# Patient Record
Sex: Female | Born: 1990 | Race: White | Hispanic: No | Marital: Single | Smoking: Never smoker
Health system: Southern US, Community
[De-identification: ages and names within clinical notes are randomized; demographics above are authoritative.]

## PROBLEM LIST (undated history)

## (undated) DIAGNOSIS — F909 Attention-deficit hyperactivity disorder, unspecified type: Secondary | ICD-10-CM

## (undated) DIAGNOSIS — G43709 Chronic migraine without aura, not intractable, without status migrainosus: Secondary | ICD-10-CM

## (undated) DIAGNOSIS — IMO0002 Reserved for concepts with insufficient information to code with codable children: Secondary | ICD-10-CM

## (undated) DIAGNOSIS — S83282A Other tear of lateral meniscus, current injury, left knee, initial encounter: Principal | ICD-10-CM

## (undated) DIAGNOSIS — I498 Other specified cardiac arrhythmias: Secondary | ICD-10-CM

## (undated) DIAGNOSIS — G43909 Migraine, unspecified, not intractable, without status migrainosus: Secondary | ICD-10-CM

## (undated) DIAGNOSIS — Z98811 Dental restoration status: Secondary | ICD-10-CM

## (undated) DIAGNOSIS — Z87898 Personal history of other specified conditions: Secondary | ICD-10-CM

## (undated) DIAGNOSIS — R000 Tachycardia, unspecified: Secondary | ICD-10-CM

## (undated) HISTORY — DX: Attention-deficit hyperactivity disorder, unspecified type: F90.9

## (undated) HISTORY — DX: Tachycardia, unspecified: R00.0

## (undated) HISTORY — DX: Other tear of lateral meniscus, current injury, left knee, initial encounter: S83.282A

## (undated) HISTORY — PX: WISDOM TOOTH EXTRACTION: SHX21

## (undated) HISTORY — DX: Migraine, unspecified, not intractable, without status migrainosus: G43.909

---
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
## 2010-01-27 ENCOUNTER — Telehealth: Payer: Self-pay | Admitting: Internal Medicine

## 2010-04-14 ENCOUNTER — Ambulatory Visit: Payer: Self-pay | Admitting: Internal Medicine

## 2010-04-14 DIAGNOSIS — R519 Headache, unspecified: Secondary | ICD-10-CM | POA: Insufficient documentation

## 2010-04-14 DIAGNOSIS — R51 Headache: Secondary | ICD-10-CM | POA: Insufficient documentation

## 2010-05-29 ENCOUNTER — Telehealth: Payer: Self-pay | Admitting: *Deleted

## 2010-06-22 ENCOUNTER — Telehealth: Payer: Self-pay | Admitting: Internal Medicine

## 2010-08-23 ENCOUNTER — Telehealth: Payer: Self-pay | Admitting: *Deleted

## 2010-09-06 ENCOUNTER — Telehealth: Payer: Self-pay | Admitting: Internal Medicine

## 2010-10-25 ENCOUNTER — Telehealth: Payer: Self-pay | Admitting: *Deleted

## 2010-11-21 NOTE — Progress Notes (Signed)
Summary: pt needs script for 3 month supply non generic Adderall XR  Phone Note Call from Patient Call back at Home Phone [phone]   Caller: mom-Tina Summary of Call: Pt is need script for non generic Adderall XR 25mg . Brand only. Need 3 month supply. Initial call taken by: Lucy Antigua,  November 21, 2009 11:27 AM  Follow-up for Phone Call        Rx is ready to pick up. Left message on machine. Follow-up by: Romualdo Bolk, CMA (AAMA),  November 21, 2009 5:29 PM    Prescriptions: ADDERALL XR 25 MG XR24H-CAP (AMPHETAMINE-DEXTROAMPHETAMINE) 1 by mouth once daily. DAW Brand medically necessary #90 x 0   Entered by:   Romualdo Bolk, CMA (AAMA)   Authorized by:   Madelin Headings MD   Signed by:   Romualdo Bolk, CMA (AAMA) on 11/21/2009   Method used:   Reprint   RxID:   [id number] ADDERALL XR 25 MG XR24H-CAP (AMPHETAMINE-DEXTROAMPHETAMINE) 1 by mouth once daily. DAW Brand medically necessary #90 x 0   Entered by:   Romualdo Bolk, CMA (AAMA)   Authorized by:   Madelin Headings MD   Signed by:   Romualdo Bolk, CMA (AAMA) on 11/21/2009   Method used:   Print then Give to Patient   RxID:   [id number]  Reprinted due to md signing on wrong side. Romualdo Bolk, CMA (AAMA)  November 21, 2009 5:03 PM

## 2010-11-21 NOTE — Progress Notes (Signed)
Summary: Pt req scripts for Adderall XR 25mg  and Adderall 10mg   Phone Note Refill Request Message from:  mom-[name] on June 22, 2010 1:26 PM  Refills Requested: Medication #1:  ADDERALL XR 25 MG XR24H-CAP 1 by mouth once daily. DAW [BMN]   Supply Requested: 3 months  Medication #2:  ADDERALL 10 MG TABS 1 by mouth once daily as needed   Supply Requested: 3 months Pt is coming home from college this weekend.   Method Requested: Pick up at Office Initial call taken by: Lucy Antigua,  June 22, 2010 1:26 PM  Follow-up for Phone Call        Mom called saying that she wants a 90 days on the adderall 10mg  because her classes start later now. Can we do this? Follow-up by: Romualdo Bolk, CMA (AAMA),  June 23, 2010 11:39 AM  Additional Follow-up for Phone Call Additional follow up Details #1::        ok to do as long as  meds locked up and safe in such a large amount.     Additional Follow-up by: Madelin Headings MD,  June 23, 2010 1:01 PM    Additional Follow-up for Phone Call Additional follow up Details #2::    Spoke with mom and pt keeps this in a locked box in a fake book.  Shreaded the #30 on Adderall 10mg  and gave #90.  Follow-up by: Romualdo Bolk, CMA (AAMA),  June 23, 2010 1:03 PM  Prescriptions: ADDERALL 10 MG TABS (AMPHETAMINE-DEXTROAMPHETAMINE) 1 by mouth once daily as needed  #90 x 0   Entered by:   Romualdo Bolk, CMA (AAMA)   Authorized by:   Madelin Headings MD   Signed by:   Romualdo Bolk, CMA (AAMA) on 06/23/2010   Method used:   Print then Give to Patient   RxID:   [id number] ADDERALL XR 25 MG XR24H-CAP (AMPHETAMINE-DEXTROAMPHETAMINE) 1 by mouth once daily. DAW Brand medically necessary #90 x 0   Entered by:   Romualdo Bolk, CMA (AAMA)   Authorized by:   Madelin Headings MD   Signed by:   Romualdo Bolk, CMA (AAMA) on 06/23/2010   Method used:   Print then Give to Patient   RxID:    [id number] ADDERALL 10 MG TABS (AMPHETAMINE-DEXTROAMPHETAMINE) 1 by mouth once daily as needed  #30 x 0   Entered by:   Romualdo Bolk, CMA (AAMA)   Authorized by:   Madelin Headings MD   Signed by:   Romualdo Bolk, CMA (AAMA) on 06/23/2010   Method used:   Print then Give to Patient   RxID:   [id number]

## 2010-11-21 NOTE — Progress Notes (Signed)
Summary: REQ FOR REFILL (Adderall / Adderall XR)  Phone Note Refill Request Call back at [phone]   Refills Requested: Medication #1:  ADDERALL XR 25 MG XR24H-CAP 1 by mouth once daily. DAW [BMN]   Notes: x 3 mths  Medication #2:  ADDERALL 10 MG TABS 1 by mouth once daily as needed   Notes: x 3 mths Initial call taken by: Debbra Riding,  January 27, 2010 11:37 AM Caller: Patient  [phone] Reason for Call: Refill Medication Summary of Call:  Initial call taken by: Debbra Riding,  January 27, 2010 11:34 AM  Follow-up for Phone Call        Appt made for May 24,2011. Pt does 90 days at [facility]. Follow-up by: Romualdo Bolk, CMA (AAMA),  January 27, 2010 1:34 PM    Prescriptions: ADDERALL 10 MG TABS (AMPHETAMINE-DEXTROAMPHETAMINE) 1 by mouth once daily as needed  #30 x 0   Entered by:   Romualdo Bolk, CMA (AAMA)   Authorized by:   Madelin Headings MD   Signed by:   Romualdo Bolk, CMA (AAMA) on 01/27/2010   Method used:   Print then Give to Patient   RxID:   [id number] ADDERALL XR 25 MG XR24H-CAP (AMPHETAMINE-DEXTROAMPHETAMINE) 1 by mouth once daily. DAW Brand medically necessary #90 x 0   Entered by:   Romualdo Bolk, CMA (AAMA)   Authorized by:   Madelin Headings MD   Signed by:   Romualdo Bolk, CMA (AAMA) on 01/27/2010   Method used:   Print then Give to Patient   RxID:   [id number]

## 2010-11-21 NOTE — Progress Notes (Signed)
Summary: refill on loestrin fe 24  Phone Note From Pharmacy   Caller: [facility]* Reason for Call: Needs renewal Details for Reason: Loestrin fe 24 Summary of Call: last filled on 06/07/10 #84 Initial call taken by: Romualdo Bolk, CMA (AAMA),  August 23, 2010 3:38 PM  Follow-up for Phone Call        refill x1  Follow-up by: Madelin Headings MD,  August 23, 2010 5:02 PM  Additional Follow-up for Phone Call Additional follow up Details #1::        Rx sent to pharmacy. Additional Follow-up by: Romualdo Bolk, CMA (AAMA),  August 24, 2010 8:31 AM    New/Updated Medications: LOESTRIN FE 1/20 1-20 MG-MCG TABS (NORETHIN ACE-ETH ESTRAD-FE) 1 by mouth once daily Prescriptions: LOESTRIN FE 1/20 1-20 MG-MCG TABS (NORETHIN ACE-ETH ESTRAD-FE) 1 by mouth once daily  #84 x 0   Entered by:   Romualdo Bolk, CMA (AAMA)   Authorized by:   Madelin Headings MD   Signed by:   Romualdo Bolk, CMA (AAMA) on 08/24/2010   Method used:   Electronically to        [facility]* (retail)       [address]       [address] [place] [address]       [place], [place]  [address]       Ph: [phone]       Fax: [phone]   RxID:   [id number]

## 2010-11-21 NOTE — Progress Notes (Signed)
Summary: REFILL  Phone Note Refill Request Call back at [phone]   Refills Requested: Medication #1:  ADDERALL XR 25 MG XR24H-CAP 1 by mouth once daily  Medication #2:  ADDERALL 10 MG TABS 1 by mouth once daily as needed MARK AS BRAND NAME ONLY ON XR 25MG . CALL MOM WHEN READY.  Initial call taken by: Warnell Forester,  October 26, 2009 9:04 AM  Follow-up for Phone Call        Mom aware that rx is ready to pick up. Follow-up by: Romualdo Bolk, CMA (AAMA),  October 26, 2009 9:51 AM    New/Updated Medications: ADDERALL XR 25 MG XR24H-CAP (AMPHETAMINE-DEXTROAMPHETAMINE) 1 by mouth once daily. DAW [BMN] Prescriptions: ADDERALL XR 25 MG XR24H-CAP (AMPHETAMINE-DEXTROAMPHETAMINE) 1 by mouth once daily. DAW Brand medically necessary #30 x 0   Entered by:   Romualdo Bolk, CMA (AAMA)   Authorized by:   Madelin Headings MD   Signed by:   Romualdo Bolk, CMA (AAMA) on 10/26/2009   Method used:   Reprint   RxID:   [id number] ADDERALL 10 MG TABS (AMPHETAMINE-DEXTROAMPHETAMINE) 1 by mouth once daily as needed  #30 x 0   Entered by:   Romualdo Bolk, CMA (AAMA)   Authorized by:   Madelin Headings MD   Signed by:   Romualdo Bolk, CMA (AAMA) on 10/26/2009   Method used:   Print then Give to Patient   RxID:   [id number] ADDERALL XR 25 MG XR24H-CAP (AMPHETAMINE-DEXTROAMPHETAMINE) 1 by mouth once daily Brand medically necessary #30 x 0   Entered by:   Romualdo Bolk, CMA (AAMA)   Authorized by:   Madelin Headings MD   Signed by:   Romualdo Bolk, CMA (AAMA) on 10/26/2009   Method used:   Print then Give to Patient   RxID:   [id number]  Reprinted due to md signing wrong slot. Romualdo Bolk, CMA (AAMA)  October 26, 2009 9:49 AM

## 2010-11-21 NOTE — Progress Notes (Signed)
Summary: new rx  Phone Note Call from Patient Call back at Home Phone [phone]   Caller: Patient Call For: Madelin Headings MD Summary of Call: pt needs new rx  adderall 10 mg Initial call taken by: Heron Sabins,  May 29, 2010 2:35 PM  Follow-up for Phone Call        done Follow-up by: Nelwyn Salisbury MD,  May 29, 2010 4:35 PM  Additional Follow-up for Phone Call Additional follow up Details #1::        Pt's mom aware that rx is ready to pick up Additional Follow-up by: Romualdo Bolk, CMA (AAMA),  May 29, 2010 4:50 PM    Prescriptions: ADDERALL 10 MG TABS (AMPHETAMINE-DEXTROAMPHETAMINE) 1 by mouth once daily as needed  #30 x 0   Entered and Authorized by:   Nelwyn Salisbury MD   Signed by:   Nelwyn Salisbury MD on 05/29/2010   Method used:   Print then Give to Patient   RxID:   [id number]

## 2010-11-21 NOTE — Assessment & Plan Note (Signed)
Summary: well child ck/ssc   Vital Signs:  Patient profile:   20 year old female Menstrual status:  regular LMP:     03/31/2010 Height:      65.25 inches Weight:      110 pounds BMI:     18.23 Pulse rate:   80 / minute BP sitting:   100 / 60  (right arm) Cuff size:   regular  Vitals Entered By: Romualdo Bolk, CMA (AAMA) (April 14, 2010 2:17 PM) CC: Well Child Check  Vision Screening:Left eye w/o correction: 20 / 20 Right Eye w/o correction: 20 / 20 Both eyes w/o correction:  20/ 20        Vision Entered By: Romualdo Bolk, CMA Duncan Dull) (April 14, 2010 2:36 PM) LMP (date): 03/31/2010 LMP - Character: light Menarche (age onset years): 13-14   Menses interval (days): 28 Menstrual flow (days): 4 Enter LMP: 03/31/2010  Bright Futures-17-21 Years Female  Questions or Concerns: None   HEALTH   Health Status: good   ER Visits: 0   Hospitalizations: 1   Discharge Diagnosis: ACL Repair   Immunization Reaction: no reaction   Dental Visit-last 6 months yes   Brushing Teeth twice a day   Flossing once a day  HOME/FAMILY   Lives with: mother & father   Guardian: mother & father   # of Siblings: 1   Lives In: house   Shares Bedroom: no   Passive Smoke Exposure: no   Caregiver Relationships: good with mother   Father Involvement: involved   Pets in Home: yes   Type of Pets: dogs  SUBSTANCE USE   Tobacco Exposure: no tobacco use in home or friends   Tobacco Use: never   Alcohol Exposure: friends using alcohol   Alcohol Use: tried-not currently using   Marijuana Exposure: no marijuana use in home or friends   Illicit Drug Exposure: no illicit drug use in home or friends  SEXUALITY   Exposure to Sex: no friends are sexually active   Sexually Active: yes   Age of first sexual contact: 15   Lifetime Partners: 3   Condom Use: always   LMP: 03/31/2010  CURRENT HISTORY   Diet/Food: not all four food groups, frequent junk food, good appetite, and Occ. eats  from all four food groups and occ junk food.     Milk: inadequate calcium intake.     Drinks: juice 8-16 oz/day and water.     Carbonated/Caffeine Drinks: yes carbonated, yes caffeine, and <8 oz/day.     Sleep: 8hrs or more/night, has problems, frequent awakenings, no co-bedding, and in own room.     Exercise: none.     TV/Computer/Video: >2 hours total/day, has computer at home, and content not monitored.     Friends: many friends and has someone to talk to with issues.     Mental Health: high self esteem and positive body image.    SCHOOL/SCREENING   Grade Level: college and [employer].     School Performance: excellent.     Future Career Goals: college.     Behavior Concerns: no.     Vision/Hearing: no concerns with vision and no concerns with hearing.    Well Child Visit/Preventive Care  Age:  20 years old female  Home:     good family relationships, communication between adolescent/parent, and has responsibilities at home Education:     As, Bs, and good attendance Activities:     exercise, friends, and >  2 hrs TV/Computer Auto/Safety:     seatbelts, bike helmets, water safety, and sunscreen use  History of Present Illness: Cynthia Sanchez comes in today  for wellness check and med check. With mom also.  since last visit has doen well taking adderall on school days . gets HA worsening when wearing off. HA  : saw HA cloininc and giving muscl realxant  as neede mild and then strong if needed. did nt want to try a"another pill"  for suppression of HA  but trying to stop  meds such as advil  . ocass tylenol   . states  Migraines better on OCPS   otherwise daily pm HAs  continuing but not progressive. Mom want to rewrite roxinal rx   . last one was never filled and she brings it in to shred and rewrite.  So Nerida will have this  when goes  on campus.    Past History:  Past medical, surgical, family and social histories (including risk factors) reviewed, and no changes noted  (except as noted below).  Past Medical History: Reviewed history from 04/01/2009 and no changes required. ADHD Chicken pox as a child  Migraines    Nl birth hx.   Past Surgical History: Reviewed history from 04/01/2009 and no changes required. ACL Repaired 2009 left   Tonsillectomy  2008 Adenoidectomy  Past History:  Care Management: Orthopedics: Thurston Hole Gynecology: Richardson Dopp  Family History: Reviewed history from 10/03/2009 and no changes required. Father: Asthma Mother: See Mom's chart breast cancer ADHD, lipids  Siblings: Asthma Maternal Grandmother:  Maternal Grandfather:  Paternal Grandmother:  Paternal Grandfather: LIPIDs in GPS  Fa hx of HAs   Social History: Reviewed history from 08/18/2009 and no changes required. hhof 3  neg ets. Not a vegan  Exercise es    Neg tad  go to [employer] w[employer]ats to be Physc assist.  sleep interrupted.  Father Md Mom RN  light sleeper  .     Review of Systems  The patient denies anorexia, fever, weight loss, weight gain, vision loss, decreased hearing, hoarseness, chest pain, syncope, dyspnea on exertion, peripheral edema, prolonged cough, abdominal pain, melena, hematochezia, severe indigestion/heartburn, muscle weakness, transient blindness, difficulty walking, depression, abnormal bleeding, enlarged lymph nodes, and angioedema.         left knee sometimes feels off ( one hadacl repair per dr Thurston Hole) no popping of giving out  labs from october printed and reviewed  Physical Exam  General:      Well appearing adolescent,no acute distress Head:      normocephalic and atraumatic  Eyes:      PERRL, EOMs full, conjunctiva clear  Ears:      TM's pearly gray with normal light reflex and landmarks, canals clear  Nose:      Clear without Rhinorrhea Mouth:      Clear without erythema, edema or exudate, mucous membranes moist teeth in good repeair Neck:      supple without adenopathy  Chest wall:      no deformities or breast masses  noted.  Tanner IV Breast and Tanner V Breast.   Lungs:      Clear to ausc, no crackles, rhonchi or wheezing, no grunting, flaring or retractions  Heart:      RRR without murmur quiet precordium.   Abdomen:      BS+, soft, non-tender, no masses, no hepatosplenomegaly  Genitalia:      normal female  Musculoskeletal:      no scoliosis, normal gait,  normal posture ortho no acute change  left knee  no swelling or redness or tenderness ? drawer Pulses:      femoral pulses present  without delay  Extremities:      Well perfused with no cyanosis or deformity noted  Neurologic:      Neurologic exam intact  Developmental:      alert and cooperative  Skin:      intact without lesions, rashes  Cervical nodes:      no significant adenopathy.   Axillary nodes:      no significant adenopathy.   Inguinal nodes:      no significant adenopathy.   Psychiatric:      alert and cooperative   Impression & Recommendations:  Problem # 1:  Preventive Health Care (ICD-V70.0)  counseled .      Discussed nutrition,exercise,diet,healthy weight, vitamin D and calcium.   reviewed labs from october 2010  Orders: Vision Screening ([address])  Problem # 2:  ADHD (ICD-314.01) disc  use of med at school  and   seuring scheduled  meds on campus .    refill med no change for now   Problem # 3:  MIGRAINE HEADACHE (ICD-346.90) eval  by  HA clininc.   previous rx dated 12  13 2010 shredded and new one written and signed  to be used as rescue. Her updated medication list for this problem includes:    Roxicet 5-325 Mg/82ml Soln (Oxycodone-acetaminophen) .Marland Kitchen... 1 tsp   as needed headache rescue medication.    Maxalt-mlt 10 Mg Tbdp (Rizatriptan benzoate) ..... Use as needed migraine  can repeat in 4 hours  Problem # 4:  HEADACHE (ICD-784.0)  almost daily HAs    counseled about  med rebound HAs and use of meds  . if having to take med more frequently then track and follow up .  Her updated medication list for this  problem includes:    Roxicet 5-325 Mg/44ml Soln (Oxycodone-acetaminophen) .Marland Kitchen... 1 tsp   as needed headache rescue medication.    Maxalt-mlt 10 Mg Tbdp (Rizatriptan benzoate) ..... 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 1 by mouth once daily. daw 3)  Adderall 10 Mg Tabs (Amphetamine-dextroamphetamine) .Marland Kitchen.. 1 by mouth once daily as needed 4)  Roxicet 5-325 Mg/57ml Soln (Oxycodone-acetaminophen) .Marland Kitchen.. 1 tsp   as needed headache rescue medication. 5)  Maxalt-mlt 10 Mg Tbdp (Rizatriptan benzoate) .... Use as needed migraine  can repeat in 4 hours  Patient Instructions: 1)  continue same med dose   rov in 6 months  . 2)  disc caution for HA rebound.   3)  Do knee exercises  and if problem follow up with Dr Thurston Hole Prescriptions: ADDERALL 10 MG TABS (AMPHETAMINE-DEXTROAMPHETAMINE) 1 by mouth once daily as needed  #30 x 0   Entered and Authorized by:   Madelin Headings MD   Signed by:   Madelin Headings MD on 04/14/2010   Method used:   Print then Give to Patient   RxID:   [id number] ADDERALL XR 25 MG XR24H-CAP (AMPHETAMINE-DEXTROAMPHETAMINE) 1 by mouth once daily. DAW Brand medically necessary #90 x 0   Entered and Authorized by:   Madelin Headings MD   Signed by:   Madelin Headings MD  on 04/14/2010   Method used:   Print then Give to Patient   RxID:   [id number] ROXICET 5-325 MG/5ML SOLN (OXYCODONE-ACETAMINOPHEN) 1 tsp   as needed headache rescue medication.  #60cc x 0   Entered and Authorized by:   Madelin Headings MD   Signed by:   Madelin Headings MD on 04/14/2010   Method used:   Print then Give to Patient   RxID:   [id number]  ]

## 2010-11-21 NOTE — Progress Notes (Signed)
Summary: refill on adderall  Phone Note Call from Patient Call back at Home Phone [phone] Call back at [phone]   Caller: Mom Summary of Call: refill on adderall 10mg  Initial call taken by: Romualdo Bolk, CMA (AAMA),  September 06, 2010 4:56 PM    Prescriptions: ADDERALL 10 MG TABS (AMPHETAMINE-DEXTROAMPHETAMINE) 1 by mouth once daily as needed  #90 x 0   Entered by:   Romualdo Bolk, CMA (AAMA)   Authorized by:   Madelin Headings MD   Signed by:   Romualdo Bolk, CMA (AAMA) on 09/06/2010   Method used:   Print then Give to Patient   RxID:   [id number]

## 2010-11-21 NOTE — Consult Note (Signed)
[facility]   Imported By: Maryln Gottron 10/31/2009 15:15:33  _____________________________________________________________________  External Attachment:    Type:   Image     Comment:   External Document

## 2010-11-23 NOTE — Progress Notes (Signed)
Summary: Need new script for Loestrin for migraines for continous usage  Phone Note Refill Request Call back at [phone] Tinas cell Message from:  momInetta Fermo on October 25, 2010 3:42 PM  Refills Requested: Medication #1:  LOESTRIN FE 1/20 1-20 MG-MCG TABS 1 by mouth once daily   Dosage confirmed as above?Dosage Confirmed Pts mom said pharmacy will not fill, because original script was not written for continous for migraine headaches. Pls re-write script and send to [facility] asap.    Method Requested: Telephone to Pharmacy Initial call taken by: Lucy Antigua,  October 25, 2010 3:42 PM  Follow-up for Phone Call        Pt needs to schedule a follow up appt before next refill. Rx sent to pharmacy. Mom to have pt schedule a follow up appt before her next refill. Follow-up by: Romualdo Bolk, CMA (AAMA),  October 25, 2010 3:56 PM    New/Updated Medications: LOESTRIN FE 1/20 1-20 MG-MCG TABS (NORETHIN ACE-ETH ESTRAD-FE) 1 by mouth once daily continous for migraine headaches Prescriptions: LOESTRIN FE 1/20 1-20 MG-MCG TABS (NORETHIN ACE-ETH ESTRAD-FE) 1 by mouth once daily continous for migraine headaches  #84 x 0   Entered by:   Romualdo Bolk, CMA (AAMA)   Authorized by:   Madelin Headings MD   Signed by:   Romualdo Bolk, CMA (AAMA) on 10/25/2010   Method used:   Electronically to        [facility]* (retail)       [address]       [address] [place] [address]       [place], [place]  [address]       Ph: [phone]       Fax: [phone]   RxID:   [id number]

## 2010-12-19 ENCOUNTER — Telehealth: Payer: Self-pay | Admitting: *Deleted

## 2010-12-19 MED ORDER — AMPHETAMINE-DEXTROAMPHETAMINE 10 MG PO TABS: 10.0000 mg | ORAL_TABLET | Freq: Every day | ORAL | Status: DC

## 2010-12-19 MED ORDER — AMPHETAMINE-DEXTROAMPHET ER 25 MG PO CP24: 25.0000 mg | ORAL_CAPSULE | ORAL | Status: DC

## 2011-01-25 ENCOUNTER — Other Ambulatory Visit: Payer: Self-pay | Admitting: Internal Medicine

## 2011-01-30 ENCOUNTER — Telehealth: Payer: Self-pay | Admitting: *Deleted

## 2011-01-30 MED ORDER — AMPHETAMINE-DEXTROAMPHETAMINE 10 MG PO TABS: 10.0000 mg | ORAL_TABLET | Freq: Every day | ORAL | Status: DC

## 2011-01-30 NOTE — Telephone Encounter (Signed)
Mom called and states that she misplaced pt's rx because it was too early to refill. She would like to have another rx. Per Dr. Fabian Sharp- okay to do.

## 2011-01-30 NOTE — Telephone Encounter (Signed)
Pt's mom aware that rx is ready to pick up

## 2011-03-06 NOTE — Op Note (Signed)
NAMEJERRINE, URSCHEL NO.:  [id number]   MEDICAL RECORD NO.:  [id number]          PATIENT TYPE:  AMB   LOCATION:  DSC                          FACILITY:  [facility]   PHYSICIAN:  Robert A. Thurston Hole, M.D. DATE OF BIRTH:  08-Jun-1991   DATE OF PROCEDURE:  03/02/2008  DATE OF DISCHARGE:  03/03/2008                               OPERATIVE REPORT   PREOPERATIVE DIAGNOSES:  1. Left knee anterior cruciate ligament tear.  2. Left knee lateral meniscus tear.   POSTOPERATIVE DIAGNOSES:  1. Left knee anterior cruciate ligament tear.  2. Left knee lateral meniscus tear.   PROCEDURE:  1. Left knee examination under anesthesia followed by arthroscopically      assisted endoscopic hamstring autograft ACL reconstruction using 8      x 28-mm femoral interference BioScrew and 9 x 28-mm tibial      interference BioScrew with PushLock Anchor for tibial post.  2. Left knee lateral meniscus repair using Arthrex meniscal repair      suture x 1.   SURGEON:  Elana Alm. Thurston Hole, M.D.   ASSISTANT:  Julien Girt, PA   ANESTHESIA:  General.   OPERATIVE TIME:  1 hour and 45 minutes.   COMPLICATIONS:  None.   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  Examined MRI has revealed a complete ACL  tear with possible lateral meniscus tear.  She is now to undergo  arthroscopy with ACL reconstruction.   DESCRIPTION:  Tyishia was brought to the operating room on Mar 02, 2008,  after femoral nerve block was placed in holding area by anesthesia.  She  was placed on the operative table in supine position.  After being  placed under general anesthesia, she received Ancef 1 g IV  preoperatively for prophylaxis.  Her left knee was examined under  anesthesia.  She had full range of motion, 3+ Lachman, and positive  pivot shift.  Knee is stable to varus valgus and posterior stress  with  normal patellar tracking.  The knee was sterilely injected with 0.25%  Marcaine with epinephrine.  The left leg was prepped using sterile  DuraPrep and draped using sterile technique.  Originally, through an  anterolateral portal, an arthroscope with the pump attached was placed  into an anteromedial portal and arthroscopic probe was placed.  On  initial inspection of medial compartment, the articular cartilage was  normal, and medial meniscus was normal.  Intercondylar notch was  inspected and the anterior and posterior cruciate ligaments were normal.  Lateral compartment was inspected, the articular cartilage was normal.  Lateral meniscus showed a tear of the posterior horn measuring 1.5 cm.  This was amendable to repair and one Arthrex meniscal suture was placed  in a vertical mattress suture technique with a firm and tight repair of  the this posterior horn tear.  The lateral and anterior horn of the  lateral meniscus was intact.  Popliteus tendon was intact.  Patellofemoral joint articular cartilage was normal.  The patellar  tracked normally.  Medial and lateral gutters were free of pathology.  At this point, the hamstring graft was exposed using a 3-4 cm oblique  incision of the semitendinosus and gracilis tendon attachments on the  tibia.  Underlying subcutaneous tissues were incised along with skin  incision.  Careful dissection was carried out exposing both the gracilis  and semitendinosus tendons.  Each of these were separated and then they  were harvested using a tendon stripper.  At this point, Julien Girt,  whose surgical and medical assistance, was absolutely  medically necessary to expedite the procedure and prepare the graft  while I prepared the inside of the knee to accept the graft.  She  prepared the hamstring tendons on the back surgical table while I  prepared the inside of the knee to accept this graft.  Using a ACL  tibial drill guide through the  hamstring donor site, a Steinmann pin was  drilled up in the ACL insertion point to the tibial plateau and then  overdrilled with a 7-mm drill and then this tunnel was expanded with  tunnel expanders to 8.5.  Through this tibial tunnel posterior femoral  guide was placed in the posterior femoral notch.  Steinmann pin was then  drilled up in the ACL origin point and then overdrilled with an 8-mm  drill to the depth of 30 mm leaving a posterior 2-mm bone ridge.  This  tunnel was also expanded up to an 8.5 size with a tunnel expander.  At  this point, then a double pin passer was brought up through the tibial  tunnel and joined it up to the femoral tunnel through the femoral cortex  and tied to a stab wound.  This was then used to pass the ACL hamstring  graft up to the tibial tunnel and join it into the femoral tunnel.  It  was locked in position there with a 8 x 28-mm interference BioScrew.  The knee was then brought to a full range of motion.  There was found to  be no impingement of the graft.  The tibial end of the graft was then  locked in position in the tibial tunnel with a 9 x 28-mm interference  BioScrew while Kirstin Shepperson held the knee in a 30 degrees flexed  position with the tibia held reduced on the femur.  After this was done,  then this tibial end of the graft was further secured with a 4.5-mm  PushLock, predrilling in the proximal tibia below the tibial tunnel and  then deploying the PushLock thus further backing up the tibial  interference screw.  At this point, the knee was tested for stability.  The Lachman and pivot shift were found to be gently eliminated and knee  could be brought to full range of motion with no impingement of the  graft and the lateral meniscus repair was found to be stable.  At this  point, it was felt that all pathology has been satisfactorily addressed.  The instruments were removed.  The hamstring donor site incision was  closed with 2-0  Vicryl and 4-0 Monocryl.  The orthoscopic port was  closed with 3-0 nylon.  The knee and wound were injected with 0.25%  Marcaine with epinephrine.  Sterile dressing and a long-leg splint  applied and then patient awakened and taken to recovery room in stable  condition.  Needle and sponge counts were correct x 2 at the end of the  case.   FOLLOWUP CARE:  Sehar will be followed overnight [facility] for  IV pain control, neurovascular monitoring and CPM.  Discharge tomorrow  on Percocet and Robaxin with a home CPM.  She will be seen back in the  office in a week for sutures out and followup.      Robert A. Thurston Hole, M.D.  Electronically Signed     RAW/MEDQ  D:  03/02/2008  T:  03/03/2008  Job:  [id number]

## 2011-03-06 NOTE — Op Note (Signed)
NAMEREANNE, NELLUMS NO.:  [id number]   MEDICAL RECORD NO.:  [id number]          PATIENT TYPE:  AMB   LOCATION:  DSC                          FACILITY:  [facility]   PHYSICIAN:  Newman Pies, MD            DATE OF BIRTH:  12-13-1990   DATE OF PROCEDURE:  10/10/2007  DATE OF DISCHARGE:                               OPERATIVE REPORT   SURGEON:  Newman Pies, MD   PREOPERATIVE DIAGNOSES:  1. Adenotonsillar hypertrophy.  2. Chronic tonsillitis/pharyngitis.   POSTOPERATIVE DIAGNOSES:  1. Adenotonsillar hypertrophy.  2. Chronic tonsillitis/pharyngitis.   PROCEDURE PERFORMED:  Adenotonsillectomy   ANESTHESIA:  General endotracheal tube anesthesia.   COMPLICATIONS:  None.   ESTIMATED BLOOD LOSS:  Minimal.   INDICATIONS FOR PROCEDURE:  Cynthia Sanchez is a 20 year old white female  with a history of chronic tonsillitis/pharyngitis, refractory to  treatment with multiple courses of antibiotics.  On examination, she was  noted to have significant adenotonsillar hypertrophy.  Based on the  findings the decision was made for the patient to undergo  adenotonsillectomy.  The risks, benefits, alternatives and details of  the procedure were discussed with the patient and her mother.  Questions  were invited and answered.  They elected to proceed with the procedure.  Informed consent was obtained.   DESCRIPTION OF PROCEDURE:  The patient was taken to the operating room  and placed supine on the operating table.  General endotracheal tube  anesthesia was administered by the anesthesiologist.  Preop IV  antibiotic and Decadron were given.  The patient was then positioned and  prepped and draped in a standard fashion for adenotonsillectomy.  A  Crowe-Davis mouth gag was inserted into the oral cavity for exposure.  Palpation and inspection of the palate reveals no submucous cleft or  bifidity.  A red rubber catheter was inserted via the left nostril and  was used to gently retract the  soft palate.  Indirect mirror examination  of the nasopharynx reveals significant adenoid hyperplasia, obstructing  approximately 80% of the nasopharynx.  The adenoid was resected with an  electric cut adenotome.  Hemostasis was achieved with the coblator  device.  The right tonsil was grasped with a straight Allis clamp and  retracted medially.  It was resected from the underlying pharyngeal  constrictor muscles with the coblator device.  Hemostasis was also  achieved with the coblator device.  The same procedure was repeated on  the left side without exception.  The tonsils and adenoids were sent to  the pathology department.  The surgical sites were copiously irrigated.  An orogastric tube was passed to evacuate the stomach contents.  The  Crowe-Davis mouth gag was released.  Final inspection of the lips,  teeth, tongue and surrounding structures revealed no evidence of injury.  The care of the patient was turned over to the anesthesiologist.  The  patient was awakened from anesthesia without difficulty.  She was  extubated and transferred to the recovery room in good condition.   OPERATIVE FINDINGS:  Tonsils 2+ bilaterally.  Significant adenoid  hyperplasia.  SPECIMENS REMOVED:  Adenoids and tonsils.   FOLLOWUP CARE:  The patient will be observed in the postanesthetic care  unit.  She will be discharged home once she is awake, alert and  tolerating p.o.  She will follow up in my office in approximately 2  weeks.  She will be placed on Roxicet 5-10 mL p.o. q.4-6 h p.r.n. pain  and amoxicillin 500 mg p.o. t.i.d. for 5 days.      Newman Pies, MD  Electronically Signed     ST/MEDQ  D:  10/10/2007  T:  10/10/2007  Job:  [id number]

## 2011-03-27 ENCOUNTER — Ambulatory Visit (INDEPENDENT_AMBULATORY_CARE_PROVIDER_SITE_OTHER)
Admission: RE | Admit: 2011-03-27 | Discharge: 2011-03-27 | Disposition: A | Discharge status: Home / Self Care | Payer: 59 | Source: Ambulatory Visit | Attending: Internal Medicine | Admitting: Internal Medicine

## 2011-03-27 ENCOUNTER — Other Ambulatory Visit (INDEPENDENT_AMBULATORY_CARE_PROVIDER_SITE_OTHER): Payer: 59

## 2011-03-27 ENCOUNTER — Encounter: Payer: Self-pay | Admitting: Internal Medicine

## 2011-03-27 ENCOUNTER — Ambulatory Visit (INDEPENDENT_AMBULATORY_CARE_PROVIDER_SITE_OTHER): Payer: 59 | Admitting: Internal Medicine

## 2011-03-27 VITALS — BP 102/70 | HR 104 | Temp 98.6°F | Wt 108.0 lb

## 2011-03-27 DIAGNOSIS — R079 Chest pain, unspecified: Secondary | ICD-10-CM

## 2011-03-27 DIAGNOSIS — R0781 Pleurodynia: Secondary | ICD-10-CM

## 2011-03-27 DIAGNOSIS — F909 Attention-deficit hyperactivity disorder, unspecified type: Secondary | ICD-10-CM

## 2011-03-27 DIAGNOSIS — R071 Chest pain on breathing: Secondary | ICD-10-CM

## 2011-03-27 DIAGNOSIS — Z79899 Other long term (current) drug therapy: Secondary | ICD-10-CM

## 2011-03-27 DIAGNOSIS — J9383 Other pneumothorax: Secondary | ICD-10-CM

## 2011-03-27 DIAGNOSIS — Z79890 Hormone replacement therapy (postmenopausal): Secondary | ICD-10-CM | POA: Insufficient documentation

## 2011-03-27 LAB — BASIC METABOLIC PANEL
BUN: 15 mg/dL (ref 6–23)
CO2: 24 mEq/L (ref 19–32)
Calcium: 9.4 mg/dL (ref 8.4–10.5)
Chloride: 105 mEq/L (ref 96–112)
Creatinine, Ser: 0.8 mg/dL (ref 0.4–1.2)
GFR: 92.36 mL/min (ref >60.00)
Glucose, Bld: 139 mg/dL — ABNORMAL HIGH (ref 70–99)
Potassium: 3.9 mEq/L (ref 3.5–5.1)
Sodium: 138 mEq/L (ref 135–145)

## 2011-03-27 LAB — CBC WITH DIFFERENTIAL/PLATELET
Basophils Relative: 0.4 % (ref 0.0–3.0)
Eosinophils Absolute: 0.1 10*3/uL (ref 0.0–0.7)
Eosinophils Relative: 0.6 % (ref 0.0–5.0)
HCT: 39.6 % (ref 36.0–46.0)
Lymphocytes Relative: 16.8 % (ref 12.0–46.0)
Lymphs Abs: 1.4 10*3/uL (ref 0.7–4.0)
MCHC: 34.6 g/dL (ref 30.0–36.0)
MCV: 93.5 fl (ref 78.0–100.0)
Monocytes Absolute: 0.2 10*3/uL (ref 0.1–1.0)
Monocytes Relative: 2.9 % — ABNORMAL LOW (ref 3.0–12.0)
Neutrophils Relative %: 79.3 % — ABNORMAL HIGH (ref 43.0–77.0)
Platelets: 209 10*3/uL (ref 150.0–400.0)
WBC: 8.3 10*3/uL (ref 4.5–10.5)

## 2011-03-27 NOTE — Progress Notes (Signed)
Addended by: Romualdo Bolk on: 03/27/2011 05:04 PM   Modules accepted: Orders

## 2011-03-27 NOTE — Progress Notes (Signed)
  Subjective:    Patient ID: Cynthia Sanchez, female    DOB: 08/23/1991, 20 y.o.   MRN: [id number]  HPI Patient comes in today for an acute visit because of the onset last night of some left periscapular pain that radiated to the back and shoulder. And then had the onset of sharp shooting pain with deep breath not associated with cough shortness of breath or fever. Left periscapular area   And massage and then got worse and radiated to front and left shoulder . No trauma   Bending over standing make it worse and laying is most comfortable.   Pain rated 8-9/10 scale . Taking naproxyn and asa without relief.  Worse at  left parasternal and to the back.  Awoke from sleep last pm.   Denies any regular exercise trauma to the area she is left-handed. She's been home from college about a month has been hanging out mostly at the pool but no excessive exercise. She has not had this kind of pain before. Her birth control pills recently switched. Denies any leg pain trauma swelling.  Review of Systems No fever   Cough uri NVD or skin changes   Has migraines  2 in last week.  NOn smoker no asthma  Maternal uncle recently with PEs   dxed . Past history family history social history reviewed in the electronic medical record.     Objective:   Physical Exam Well-developed well-nourished in no acute distress  HEENT is grossly normal TMs clear OP clear neck supple without masses thyromegaly or bruit . Chest:  Clear to A&P without wheezes rales or rhonchi CV:  S1-S2 no gallops or murmurs peripheral perfusion is normal No clubbing cyanosis or edema Abdomen:  Sof,t normal bowel sounds without hepatosplenomegaly, no guarding rebound or masses no CVA tenderness Legs :No clubbing cyanosis or edema MS no atrophy good rom.  Chest wall no focal tenderness breast exam normal Neuro: non focal  Alert oriented.  Oriented x 3 and no noted deficits in memory, attention, and speech. Skin no acute rashes. .EKG  Sinus rate  77  Short pr no qs       Assessment & Plan:  Left chest pain pleuritic and sudden.  Prob MS CWP but atypical and on  Hormonal therapy.      R/O PE  Etc .  Disc with mom as permission per patient. Get labs including d dimer and chest x ray . If all negative  Attend to like CWP.

## 2011-03-27 NOTE — Patient Instructions (Signed)
We'll do laboratory studies and chest x-ray further evaluation depending on results. Discussed this with mom also.

## 2011-03-28 ENCOUNTER — Ambulatory Visit (INDEPENDENT_AMBULATORY_CARE_PROVIDER_SITE_OTHER)
Admission: RE | Admit: 2011-03-28 | Discharge: 2011-03-28 | Disposition: A | Discharge status: Home / Self Care | Payer: 59 | Source: Ambulatory Visit | Attending: Internal Medicine | Admitting: Internal Medicine

## 2011-03-28 ENCOUNTER — Ambulatory Visit (INDEPENDENT_AMBULATORY_CARE_PROVIDER_SITE_OTHER): Payer: 59 | Admitting: Internal Medicine

## 2011-03-28 ENCOUNTER — Encounter: Payer: Self-pay | Admitting: Internal Medicine

## 2011-03-28 VITALS — BP 100/72 | HR 80 | Temp 97.8°F | Ht 65.0 in | Wt 110.9 lb

## 2011-03-28 DIAGNOSIS — J9383 Other pneumothorax: Secondary | ICD-10-CM

## 2011-03-28 DIAGNOSIS — J9311 Primary spontaneous pneumothorax: Secondary | ICD-10-CM

## 2011-03-28 NOTE — Progress Notes (Signed)
Subjective:    Patient ID: Cynthia Sanchez, female    DOB: 05/19/1991, 20 y.o.   MRN: [id number]  HPI 20 year old female with known migraine followed by Dr Catalina Lunger at [facility] center in [place]. PMD is Dr Fabian Sharp. Referred here for new onset of left pneumothorax.   She has been on birth control pills LO-ESTRIN/MICROGESTIN for 3-4 years for menstrual cramps (takes 3 months in a row, with 1 week break for past 18 months because it turned out therapeutic for migraines). Six days ago on 5/31 changed birth control pills to [facility] (due to low libido) at the end of 3 weeks under guidance of Dr. Estanislado Pandy [facility]) in mid-point of 3 months stretch. Following this on Sunday 03/25/2011 had "bad migraine" (most recent one prior was 12/07/2010) and took tizanadine 4mg . Then on  03/26/2011 still with residual headache but also in morning had new sudden onset of left side chest pain, severe 9/10, radiating all over (coincided with change of birth control pills). Saw PMD yesterday 03/27/2011 did d-dimer was normal. CXR showed left pneumothorax (10%) and referred here. Today, pain is significantly better spontaneously wihtout treatment. Currenty pain in left sternal area with associated pain in back radiating across left shoulder. WAs constant but now intermittent. Made worse with deep inspiration. Lying down helps. Currently rates pain as very mild (2/10) and improving. Denies associated fever, wheeze, chills, nausea, vomitting, edema. No prior pneumothorax. No prior diagnosis of asthma (sister aged 1, dad, uncle, grandparents have asthma). Denies lifting heavy objects, using music equipment or menstrual cycle.  Post visit: I spoke to Dr Estanislado Pandy her gyn doc. States that no known hx of endometriosis. Recent switch was as for above. She did not patient 3-4 years ago but does not believe patient had endometriosis back then    Review of Systems  Constitutional: Negative for fever and unexpected weight change.    HENT: Negative for ear pain, nosebleeds, congestion, sore throat, rhinorrhea, sneezing, trouble swallowing, dental problem, postnasal drip and sinus pressure.   Eyes: Negative for redness and itching.  Respiratory: Positive for shortness of breath. Negative for cough, chest tightness and wheezing.   Cardiovascular: Positive for chest pain and palpitations. Negative for leg swelling.  Gastrointestinal: Negative for nausea and vomiting.  Genitourinary: Negative for dysuria.  Musculoskeletal: Negative for joint swelling.  Skin: Negative for rash.  Neurological: Positive for headaches.  Hematological: Does not bruise/bleed easily.  Psychiatric/Behavioral: Negative for dysphoric mood. The patient is not nervous/anxious.        Objective:   Physical Exam  Vitals reviewed. Constitutional: She is oriented to person, place, and time. She appears well-developed and well-nourished. No distress.       No evidence of marfan  HENT:  Head: Normocephalic and atraumatic.  Right Ear: External ear normal.  Left Ear: External ear normal.  Mouth/Throat: Oropharynx is clear and moist. No oropharyngeal exudate.  Eyes: Conjunctivae and EOM are normal. Pupils are equal, round, and reactive to light. Right eye exhibits no discharge. Left eye exhibits no discharge. No scleral icterus.  Neck: Normal range of motion. Neck supple. No JVD present. No tracheal deviation present. No thyromegaly present.  Cardiovascular: Normal rate, regular rhythm, normal heart sounds and intact distal pulses.  Exam reveals no gallop and no friction rub.   No murmur heard. Pulmonary/Chest: Effort normal and breath sounds normal. No respiratory distress. She has no wheezes. She has no rales. She exhibits no tenderness.  Abdominal: Soft. Bowel sounds are normal. She exhibits  no distension and no mass. There is no tenderness. There is no rebound and no guarding.  Musculoskeletal: Normal range of motion. She exhibits no edema and no  tenderness.  Lymphadenopathy:    She has no cervical adenopathy.  Neurological: She is alert and oriented to person, place, and time. She has normal reflexes. No cranial nerve deficit. She exhibits normal muscle tone. Coordination normal.  Skin: Skin is warm and dry. No rash noted. She is not diaphoretic. No erythema. No pallor.  Psychiatric: She has a normal mood and affect. Her behavior is normal. Judgment and thought content normal.          Assessment & Plan:

## 2011-03-28 NOTE — Patient Instructions (Addendum)
Please do cxr in 1 week We will call you with results I am going to investigate a little bit more about role of your birth control pills in this and hormonal changes Be cautious about heavy lifting, prolonged breath holds, straining, running till atleast all resolved on cxr and furhter advice during followup

## 2011-03-30 ENCOUNTER — Telehealth: Payer: Self-pay | Admitting: Internal Medicine

## 2011-03-30 NOTE — Telephone Encounter (Signed)
Cynthia Sanchez, I changed my mind. Please have her come in (not necessarily on day of cxr) but around that time for review and wrap up. You can add on

## 2011-03-30 NOTE — Telephone Encounter (Signed)
[email or web address].Yan Okray, CMA  

## 2011-04-03 NOTE — Telephone Encounter (Signed)
Pt set to see MR tomorrow at 9am. Carron Curie, CMA

## 2011-04-04 ENCOUNTER — Ambulatory Visit (INDEPENDENT_AMBULATORY_CARE_PROVIDER_SITE_OTHER): Payer: 59 | Admitting: Internal Medicine

## 2011-04-04 ENCOUNTER — Encounter: Payer: Self-pay | Admitting: Internal Medicine

## 2011-04-04 ENCOUNTER — Ambulatory Visit (INDEPENDENT_AMBULATORY_CARE_PROVIDER_SITE_OTHER)
Admission: RE | Admit: 2011-04-04 | Discharge: 2011-04-04 | Disposition: A | Discharge status: Home / Self Care | Payer: 59 | Source: Ambulatory Visit | Attending: Internal Medicine | Admitting: Internal Medicine

## 2011-04-04 VITALS — BP 102/62 | HR 73 | Temp 97.8°F | Ht 65.0 in | Wt 110.0 lb

## 2011-04-04 DIAGNOSIS — J9383 Other pneumothorax: Secondary | ICD-10-CM

## 2011-04-04 DIAGNOSIS — J9311 Primary spontaneous pneumothorax: Secondary | ICD-10-CM | POA: Insufficient documentation

## 2011-04-04 NOTE — Progress Notes (Signed)
  Subjective:    Patient ID: Cynthia Sanchez, female    DOB: Dec 25, 1990, 20 y.o.   MRN: [id number]  HPI Cynthia Sanchez returns for 1 week fu  Of Left side primary spontaneous pneumothorax. Since last week when Cynthia Sanchez cxr was improving she reports asymptomatic state. No complaints. No strenuous activities. No valsalva. No wheeze. No cough.. No dyspnea, No chest pain. Though in office she sneezed and Cynthia Sanchez left eye turned red x 1, she admitted to some spring allergy history. Otherwise well. Mom concurs.   CXR shows complete resolution of left sided pneumothorax   Review of Systems  Constitutional: Negative for fever and unexpected weight change.  HENT: Negative for ear pain, nosebleeds, congestion, sore throat, rhinorrhea, sneezing, trouble swallowing, dental problem, postnasal drip and sinus pressure.   Eyes: Negative for redness and itching.  Respiratory: Negative for cough, chest tightness, shortness of breath and wheezing.   Cardiovascular: Negative for palpitations and leg swelling.  Gastrointestinal: Negative for nausea and vomiting.  Genitourinary: Negative for dysuria.  Musculoskeletal: Negative for joint swelling.  Skin: Negative for rash.  Neurological: Negative for headaches.  Hematological: Does not bruise/bleed easily.  Psychiatric/Behavioral: Negative for dysphoric mood. The patient is not nervous/anxious.        Objective:   Physical Exam  Constitutional: She is oriented to person, place, and time. She appears well-developed and well-nourished. No distress.  HENT:  Head: Normocephalic and atraumatic.  Eyes: Pupils are equal, round, and reactive to light.  Neck: Normal range of motion.  Pulmonary/Chest: Effort normal.  Musculoskeletal: Normal range of motion. She exhibits no edema.  Neurological: She is alert and oriented to person, place, and time. No cranial nerve deficit.  Skin: Skin is warm. She is not diaphoretic.  Psychiatric: She has a normal mood and affect. Cynthia Sanchez behavior  is normal. Judgment and thought content normal.          Assessment & Plan:

## 2011-04-04 NOTE — Patient Instructions (Signed)
Your pneumothorax has resolved There is some low  risk for recurrence, IF this happens call us 24/7 for diretctions Monitor your health Avoid strenuous activities with common sense

## 2011-04-04 NOTE — Assessment & Plan Note (Signed)
REsolved. Etiology unclear. There  Is no history of renal tumors or endometriosis to invoke rare etiologies of LAM or catamenial pneumothorax. Moreover, OCPs form the Rx for endometriosis. IN discussion with Dr. Estanislado Pandy her OB/GYN last week, there is no hx of endometriosis diagnosed in Cynthia Sanchez. She denies any valsalva kind of activities. Shared some new literature linking weather changes to pneumothorax. Advised overall benign prognosis with small rate of recurrence. Advised to go to [facility] or call us 24/7 if symptoms recur. If recurs, will need CT chest and ? Abdomen and consideration for pleurodesis. Advised common sense approach to avoid strenuous activities.Return as needed.

## 2011-04-08 NOTE — Assessment & Plan Note (Signed)
Unclear why she suffered pneumothorax. No evidence of heavy straining or valsalva. No injury hx. No prior hx of endometriosis. No hx to suggest LAM (rare condition). Odd that was temporally related to OCP change and migraine. In any event it is small and spontaneously resolving. I will get repeat cxr in 1 week to reassess. Meanwhile, advised precautions. Will send copy of note to dad. For now hold off on CT chest

## 2011-04-11 ENCOUNTER — Other Ambulatory Visit: Payer: Self-pay | Admitting: Internal Medicine

## 2011-04-11 MED ORDER — AMPHETAMINE-DEXTROAMPHET ER 25 MG PO CP24: 25.0000 mg | ORAL_CAPSULE | ORAL | Status: DC

## 2011-04-11 NOTE — Telephone Encounter (Signed)
Pt's mom aware of this. 

## 2011-04-11 NOTE — Telephone Encounter (Signed)
Pt is needing script for Adderall XR 25 mg

## 2011-05-10 ENCOUNTER — Telehealth: Payer: Self-pay | Admitting: Internal Medicine

## 2011-05-10 MED ORDER — AMPHETAMINE-DEXTROAMPHETAMINE 10 MG PO TABS: 10.0000 mg | ORAL_TABLET | Freq: Every day | ORAL | Status: DC

## 2011-05-10 MED ORDER — AMPHETAMINE-DEXTROAMPHET ER 25 MG PO CP24: 25.0000 mg | ORAL_CAPSULE | ORAL | Status: DC

## 2011-05-10 NOTE — Telephone Encounter (Signed)
Refill Adderall 10mg  and Adderall xr 25mg .

## 2011-05-10 NOTE — Telephone Encounter (Signed)
Pt needs to schedule a follow up appt before next refill.  Mom aware that pt needs a follow up appt and that rx will be ready this afternoon.

## 2011-05-10 NOTE — Telephone Encounter (Signed)
rx picked up

## 2011-05-25 ENCOUNTER — Encounter: Payer: Self-pay | Admitting: Internal Medicine

## 2011-05-25 ENCOUNTER — Ambulatory Visit (INDEPENDENT_AMBULATORY_CARE_PROVIDER_SITE_OTHER): Payer: 59 | Admitting: Internal Medicine

## 2011-05-25 VITALS — BP 110/70 | HR 78 | Temp 98.8°F | Wt 107.0 lb

## 2011-05-25 DIAGNOSIS — J9311 Primary spontaneous pneumothorax: Secondary | ICD-10-CM

## 2011-05-25 DIAGNOSIS — Z79890 Hormone replacement therapy (postmenopausal): Secondary | ICD-10-CM

## 2011-05-25 DIAGNOSIS — R51 Headache: Secondary | ICD-10-CM

## 2011-05-25 DIAGNOSIS — F909 Attention-deficit hyperactivity disorder, unspecified type: Secondary | ICD-10-CM

## 2011-05-25 DIAGNOSIS — Z79899 Other long term (current) drug therapy: Secondary | ICD-10-CM

## 2011-05-25 NOTE — Progress Notes (Signed)
  Subjective:    Patient ID: Cynthia Sanchez, female    DOB: 1991-03-28, 20 y.o.   MRN: [id number]  HPI Patient comes in today for a same-day visit for followup of a number of problems. She is to go back to school soon. She is concerned because of a recent increase in her migraines. OCP were changed because of decreased libido by her GYN  Has had 3 migraines    Since then and usually doesn't get them Saw Dr Sharene Skeans   Earlier in summer before this began Change to orthocyclin from loestrin 1/20  beginning   June .   Per Dr Estanislado Pandy.   Migraines with aura hx   Takes zanaflex for her migraines NO cp sob  Collapsed lung    Or recurrance .   Review of Systems No cp sob chage in habits   Neck pain tauma or vision change to go back to school soon not taking adderall in the summer takes long and short day meds.   Past history family history social history reviewed in the electronic medical record.     Objective:   Physical Exam WDWN in nad   HEENT:  grossly nl Neck supple  No masses Chest:  Clear to A&P without wheezes rales or rhonchi CV:  S1-S2 no gallops or murmurs peripheral perfusion is normal  Abdomen:  Sof,t normal bowel sounds without hepatosplenomegaly, no guarding rebound or masses no CVA tenderness Neuro non focal.    Assessment & Plan:  Headaches  Increase  Frequency   Hx of  Aura  ? If related to ocp change done by gyne for dec libido.   Possible but unsure next step. No other   Triggers obvious.   Rec  Record HAs  And call Dr Lennie Muckle office and  See if change again is in order.    Can go back to loestrin  in the meantime if  persistent or progressive . Tried to obtain her phone number bu NA to our practice and prev practice closed this Friday afternoon.   SP pneumothorax mild and self resolved .   Close observation for recurrence as per pulm recs  ADD disc   Use of meds and consider taking more long acting during school years.  Total visit > 50% spentcounseling  and  coordinating care

## 2011-05-25 NOTE — Patient Instructions (Addendum)
Suggest contact Dr Sharene Skeans and or Dr Estanislado Pandy about the next step with the HAs  If  persistent or progressive can go back to the loestrin in the meantime.  We do not have her phone number   available to [place] today . Consider taking the xr adderall  On most school days as we discussed .

## 2011-05-29 ENCOUNTER — Telehealth: Payer: Self-pay | Admitting: Internal Medicine

## 2011-05-29 NOTE — Telephone Encounter (Signed)
Spoke with pt. She is concerned about her mother. Staff message sent to Dr. Fabian Sharp about daughters concerns. Daughter advised that nothing about her mother's health can be discussed with her but that I would send Dr. Fabian Sharp an Lorain Childes about daughter's concerns

## 2011-05-29 NOTE — Telephone Encounter (Signed)
Pt called and is req that Dr Fabian Sharp call her re: pts mother. Pls call. Pt aware that Dr Fabian Sharp is out of office until next week.

## 2011-06-11 ENCOUNTER — Telehealth: Payer: Self-pay | Admitting: Internal Medicine

## 2011-06-11 NOTE — Telephone Encounter (Signed)
Message copied by Madelin Headings on Mon Jun 11, 2011  7:29 PM ------      Message from: Beverely Low      Created: Tue May 29, 2011  4:34 PM       This young pt is concerned that her mother isn't being truthful with you about mother's drinking and smoking and wants to ensure that the proper tests are done on the mother to check for liver damage and such. Daughter was advised that I am unable to discuss her mother's health with her but that I would send you an FYI about her concerns

## 2011-07-27 LAB — POCT HEMOGLOBIN-HEMACUE
Hemoglobin: 14
Operator id: 15436

## 2011-09-06 ENCOUNTER — Telehealth: Payer: Self-pay | Admitting: Internal Medicine

## 2011-09-06 NOTE — Telephone Encounter (Signed)
Refill Aderall xr  90 days supplys and Adderal 10mg  60 day.

## 2011-09-07 MED ORDER — AMPHETAMINE-DEXTROAMPHET ER 25 MG PO CP24: 25.0000 mg | ORAL_CAPSULE | ORAL | Status: DC

## 2011-09-07 MED ORDER — AMPHETAMINE-DEXTROAMPHETAMINE 10 MG PO TABS: 10.0000 mg | ORAL_TABLET | Freq: Every day | ORAL | Status: DC

## 2011-09-07 NOTE — Telephone Encounter (Signed)
Rx ready to pick up

## 2012-01-02 ENCOUNTER — Telehealth: Payer: Self-pay | Admitting: Internal Medicine

## 2012-01-02 MED ORDER — AMPHETAMINE-DEXTROAMPHET ER 25 MG PO CP24: 25.0000 mg | ORAL_CAPSULE | ORAL | Status: DC

## 2012-01-02 MED ORDER — AMPHETAMINE-DEXTROAMPHETAMINE 10 MG PO TABS: 10.0000 mg | ORAL_TABLET | Freq: Every day | ORAL | Status: DC

## 2012-01-02 NOTE — Telephone Encounter (Signed)
Patient's mom called stating the patient need a refill on her adderal 10mg  and adderal extended release. Please assist.

## 2012-01-02 NOTE — Telephone Encounter (Signed)
Mom states that pt will be home at the end of May and will schedule an appt then.

## 2012-06-04 ENCOUNTER — Other Ambulatory Visit: Payer: Self-pay | Admitting: Internal Medicine

## 2012-06-04 NOTE — Telephone Encounter (Signed)
Pt is requesting name brand adderall xr 25 mg and adderall 10 mg

## 2012-06-05 ENCOUNTER — Telehealth: Payer: Self-pay | Admitting: Internal Medicine

## 2012-06-05 NOTE — Telephone Encounter (Signed)
See first task.

## 2012-06-05 NOTE — Telephone Encounter (Signed)
Pt called back to check the status of refill pt states that she is going to leave to go back to school Saturday and is hoping that she can pick it up before then

## 2012-06-05 NOTE — Telephone Encounter (Signed)
WP is out of the office and will not be back until Monday.  The pt has not been seen in over 1 year.  Pt must have OV before any refills can be given.  This is a controlled substance and is the law.  Pt notified by phone.  She will speak to her Mom and will possibly call back.

## 2012-06-05 NOTE — Telephone Encounter (Signed)
Pt has sch appt on 07-29-2012.

## 2012-06-06 ENCOUNTER — Telehealth: Payer: Self-pay | Admitting: Internal Medicine

## 2012-06-06 NOTE — Telephone Encounter (Signed)
Spoke to the pt.  Again, informed her, WP is on vacation.  Medication is a controlled substance and covering doctor will not fill because she has not been seen in over 1 year.

## 2012-06-06 NOTE — Telephone Encounter (Signed)
Pt called back to check the status of refill pt states that she is going to leave to go back to school Saturday and would like to hear from you today if she will be able to receive a refill

## 2012-06-09 NOTE — Telephone Encounter (Signed)
Informed [name] (mother) that rx is a controlled substance.  Will have to wait on WP to be back in the office.

## 2012-06-09 NOTE — Telephone Encounter (Signed)
Patient's mom called stating that she would like a call back concerning her daughter's adderall. Please assist.

## 2012-06-10 ENCOUNTER — Telehealth: Payer: Self-pay | Admitting: Internal Medicine

## 2012-06-10 MED ORDER — AMPHETAMINE-DEXTROAMPHETAMINE 10 MG PO TABS: 10.0000 mg | ORAL_TABLET | Freq: Every day | ORAL | Status: DC

## 2012-06-10 MED ORDER — AMPHETAMINE-DEXTROAMPHET ER 25 MG PO CP24: 25.0000 mg | ORAL_CAPSULE | ORAL | Status: DC

## 2012-06-10 NOTE — Telephone Encounter (Signed)
Picked up in the office by her sister.

## 2012-06-10 NOTE — Telephone Encounter (Signed)
Calling to check status on adderall. Please contact

## 2012-06-10 NOTE — Telephone Encounter (Signed)
Patient's mom called stating that she need the adderall today as she has to ship it overnight to her daughter. Please assist.

## 2012-06-26 ENCOUNTER — Other Ambulatory Visit: Payer: Self-pay | Admitting: Obstetrics and Gynecology

## 2012-07-01 ENCOUNTER — Telehealth: Payer: Self-pay | Admitting: Obstetrics and Gynecology

## 2012-07-01 NOTE — Telephone Encounter (Signed)
Spoke with pt rgd rx refill request from [facility]. Approved rx for microgesterin Fe 1/20 tab sig 1 po qd with 1 refill. Pt made a aex with AR on 07/29/12 @ 2:30. Pt's voice understanding . bt cma

## 2012-07-25 ENCOUNTER — Other Ambulatory Visit: Payer: Self-pay | Admitting: Internal Medicine

## 2012-07-25 ENCOUNTER — Telehealth: Payer: Self-pay | Admitting: Internal Medicine

## 2012-07-25 NOTE — Telephone Encounter (Signed)
Caller: [name]/Mother; Patient Name: Cynthia Sanchez; PCP: Berniece Andreas [facility]); Best Callback Phone Number: [phone]; Reason for call: Mother is caller.  She states [name] felt a lump in her breast.  [name] is very upset because her mother has had a history of breast cancer.  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  Mother is requesting that [name] have a mammogram before going back to school on Tuesday 07/29/12.  .  Mother states she was told "grape size" lump but has no other information.  Called Office to speak with staff at [facility]. Information provided to mother for [facility] of [place] [phone] and [place] [phone].  Mother expressed understanding. She will call back for questions, changes or concerns.

## 2012-07-27 NOTE — Telephone Encounter (Signed)
We should plan on getting an ultrasound asap but i  Will need to examine her at some point .  Thanks

## 2012-07-28 ENCOUNTER — Other Ambulatory Visit: Payer: Self-pay | Admitting: Family Medicine

## 2012-07-28 ENCOUNTER — Telehealth: Payer: Self-pay | Admitting: Internal Medicine

## 2012-07-28 DIAGNOSIS — N63 Unspecified lump in unspecified breast: Secondary | ICD-10-CM

## 2012-07-28 NOTE — Telephone Encounter (Signed)
Spoke to the pt to inform her that ultrasound has been ordered.  Will see in in the office on 07/29/12.

## 2012-07-28 NOTE — Telephone Encounter (Signed)
LM for the pt to return my call. 

## 2012-07-28 NOTE — Telephone Encounter (Signed)
Mom is calling back. She had called on Friday 07/25/12. See notes. She had requested her daughter get a mammogram today. She was advised to call the breast center and [facility]. Mom states she did and they advised her to call back to her PCP and get a referral. Office note sent with request. OFFICE PLEASE CALL MOM BACK.

## 2012-07-28 NOTE — Telephone Encounter (Signed)
Spoke to Dr. Fabian Sharp who stated an ultrasound would be better for age group.  Order placed in the computer and pt notified by telephone.

## 2012-07-29 ENCOUNTER — Ambulatory Visit (INDEPENDENT_AMBULATORY_CARE_PROVIDER_SITE_OTHER): Payer: 59 | Admitting: Obstetrics and Gynecology

## 2012-07-29 ENCOUNTER — Encounter: Payer: Self-pay | Admitting: Obstetrics and Gynecology

## 2012-07-29 ENCOUNTER — Ambulatory Visit
Admission: RE | Admit: 2012-07-29 | Discharge: 2012-07-29 | Disposition: A | Discharge status: Home / Self Care | Payer: 59 | Source: Ambulatory Visit | Attending: Internal Medicine | Admitting: Internal Medicine

## 2012-07-29 ENCOUNTER — Ambulatory Visit (INDEPENDENT_AMBULATORY_CARE_PROVIDER_SITE_OTHER): Payer: 59 | Admitting: Internal Medicine

## 2012-07-29 ENCOUNTER — Encounter: Payer: Self-pay | Admitting: Internal Medicine

## 2012-07-29 VITALS — BP 100/74 | HR 120 | Temp 98.9°F | Wt 101.0 lb

## 2012-07-29 VITALS — BP 98/62 | Resp 16 | Ht 65.0 in | Wt 101.0 lb

## 2012-07-29 DIAGNOSIS — N6325 Unspecified lump in the left breast, overlapping quadrants: Secondary | ICD-10-CM | POA: Insufficient documentation

## 2012-07-29 DIAGNOSIS — R51 Headache: Secondary | ICD-10-CM

## 2012-07-29 DIAGNOSIS — R634 Abnormal weight loss: Secondary | ICD-10-CM

## 2012-07-29 DIAGNOSIS — Z8659 Personal history of other mental and behavioral disorders: Secondary | ICD-10-CM

## 2012-07-29 DIAGNOSIS — N63 Unspecified lump in unspecified breast: Secondary | ICD-10-CM

## 2012-07-29 DIAGNOSIS — Z01419 Encounter for gynecological examination (general) (routine) without abnormal findings: Secondary | ICD-10-CM

## 2012-07-29 DIAGNOSIS — F909 Attention-deficit hyperactivity disorder, unspecified type: Secondary | ICD-10-CM

## 2012-07-29 DIAGNOSIS — Z124 Encounter for screening for malignant neoplasm of cervix: Secondary | ICD-10-CM

## 2012-07-29 DIAGNOSIS — R636 Underweight: Secondary | ICD-10-CM

## 2012-07-29 LAB — HEPATIC FUNCTION PANEL
ALT: 19 U/L (ref 0–35)
AST: 18 U/L (ref 0–37)
Alkaline Phosphatase: 35 U/L — ABNORMAL LOW (ref 39–117)
Bilirubin, Direct: 0.1 mg/dL (ref 0.0–0.3)
Total Bilirubin: 0.9 mg/dL (ref 0.3–1.2)

## 2012-07-29 LAB — POCT URINALYSIS DIPSTICK
Blood, UA: NEGATIVE
Leukocytes, UA: NEGATIVE
Nitrite, UA: NEGATIVE
pH, UA: 6

## 2012-07-29 LAB — BASIC METABOLIC PANEL
BUN: 19 mg/dL (ref 6–23)
Calcium: 9.4 mg/dL (ref 8.4–10.5)
GFR: 86.36 mL/min (ref >60.00)
Potassium: 3.3 mEq/L — ABNORMAL LOW (ref 3.5–5.1)

## 2012-07-29 LAB — CBC WITH DIFFERENTIAL/PLATELET
Eosinophils Relative: 1.1 % (ref 0.0–5.0)
HCT: 42.2 % (ref 36.0–46.0)
Lymphocytes Relative: 33.4 % (ref 12.0–46.0)
Lymphs Abs: 1.7 10*3/uL (ref 0.7–4.0)
Monocytes Relative: 4.2 % (ref 3.0–12.0)
Neutrophils Relative %: 60.7 % (ref 43.0–77.0)
Platelets: 182 10*3/uL (ref 150.0–400.0)
WBC: 5 10*3/uL (ref 4.5–10.5)

## 2012-07-29 LAB — LIPID PANEL
Cholesterol: 207 mg/dL — ABNORMAL HIGH (ref 0–200)
HDL: 45.3 mg/dL (ref >39.00)
Total CHOL/HDL Ratio: 5
Triglycerides: 125 mg/dL (ref 0.0–149.0)

## 2012-07-29 MED ORDER — NORETHIN ACE-ETH ESTRAD-FE 1-20 MG-MCG PO TABS: 1.0000 | ORAL_TABLET | Freq: Every day | ORAL | Status: DC

## 2012-07-29 MED ORDER — AMPHETAMINE-DEXTROAMPHETAMINE 10 MG PO TABS: 10.0000 mg | ORAL_TABLET | Freq: Every day | ORAL | Status: DC

## 2012-07-29 NOTE — Patient Instructions (Addendum)
Will notify you  of labs when available. I'm concerned about your weight loss. This could be from a combination of stress, busy schedule  side effects of the Adderall. At this time would not increase the Adderall until further assessment.  I believe that  Poor nutritional intake and suboptimal fluid intake is   contributing to some of your symptoms.  Please have breakfast or some meal before you take your Adderall first dose.  Would spread out the immediate release Adderall and take 10-15 mg before your class and then using add on of 5-10 mg to get a better persistence of medication and avoid peaking.  Monitor your oral intake fluids and meals.  If needed as an issue we can consider an SSRI in the future.  We are trying to arrange an immediate breast ultrasound in regard to the left breast lump.  Plan followup visit at her Christmas break or earlier. Contact for refills

## 2012-07-29 NOTE — Progress Notes (Signed)
Contraception Lo Estrin FE Last pap never, but pt wants today, as she will be 21 in 2 mos. Last Mammo Had U/S L breast today, due to lump L breast/ Probable fibroadenoma. Pt to see surgeon for recs.  Last Colonoscopy never Last Dexa Scan never Primary MD Dr. Fabian Sharp Abuse at Home none  Her father is cardiologist Dr. Dietrich Pates here in town.  Pt seen recently for possible breast lump - fibroadenoma vs dense fibrous tissue.  Filed Vitals:   07/29/12 1506  BP: 98/62  Resp: 16   ROS: noncontributory  Physical Examination: General appearance - alert, well appearing, and in no distress Neck - supple, no significant adenopathy Chest - clear to auscultation, no wheezes, rales or rhonchi, symmetric air entry Heart - normal rate and regular rhythm Abdomen - soft, nontender, nondistended, no masses or organomegaly Breasts - breasts appear normal, no suspicious masses, no skin or nipple changes or axillary nodes, area palpated in left breast but feels more like density of nl breast tissue Pelvic - normal external genitalia, vulva, vagina, cervix, uterus and adnexa Back exam - no CVAT Extremities - no edema, redness or tenderness in the calves or thighs  A/P Pt takes loestrin continually for  Pap today Her father plans to discuss u/s findings with a surgeon re recs for bx 24yr for AEX

## 2012-07-29 NOTE — Progress Notes (Signed)
Subjective:    Patient ID: Cynthia Sanchez, female    DOB: 17-Oct-1991, 20 y.o.   MRN: [id number]  HPI Patient comes in today for follow up of  multiple medical problems.  Last ov was over a year ago and  asked to come in for med evaluation  But has a new problem . inintially for adhd med check but over the last 3.5 to 4  weeks noted left breast lump non tender and is home from school for a few days.  Mom with hx breast cancer so concerned. No discharge or hx of same. Doesn't do reg checks.  ADHD: medication taking the IR on school days to be able to control the timing and taking 20 mg at a time qd so running out of this med  Taking xr  Prep on otherday when gets up later 830 or so. Asks about going up on meds .  Many times doesn eat before am dose of med for whatever reason sleep or planning and doesn't eat much during ay. Has never drank a lot of fluids but now less.  Very busy with classes. Mom concerned about her weight loss this semester.   She denies eating disordered behavior  No bulimia diarrhea or  Wanting to lose weight "just isn't hungry a lot"  Will get ha need of day after medication wears off but otherwise ok.  No etoh rd  Pt says was up to 112 to 115  And now down Had seen on campus psych and was given medication wellbutrin and had se so stopped and wasn't impressed with the care management so no planning to go back  Dx with dysthymia, On ocps  Review of Systems No cp sob syncope vomiting bowel changes abd pain acute depression cough  Toes always look blue per mom  Past history family history social history reviewed in the electronic medical record.    Objective:   Physical Exam BP 100/74  Pulse 120  Temp 98.9 F (37.2 C) (Oral)  Wt 101 lb (45.813 kg)  SpO2 99%  LMP 07/20/2012 repeat pulse 88 reg  WD slender underweight in NAD   Alert and nl eye contact  heent McCleary eys clear tms clear op tongue midline Neck: Supple without adenopathy or masses or bruits Chest:  Clear to A&P  without wheezes rales or rhonchi Breast: normal by inspection . No dimpling, discharge,, tenderness or discharge . Left has peanut sized area upper about 12 oclock and asymmetrical   CV:  S1-S2 no gallops or murmurs peripheral perfusion is normal  Has toe nail polish  On pale nails hands are normal  Abdomen:  Sof,t normal bowel sounds without hepatosplenomegaly, no guarding rebound or masses no CVA tenderness No clubbing  or edema Wt Readings from Last 3 Encounters:  07/29/12 101 lb (45.813 kg)  07/29/12 101 lb (45.813 kg)  05/25/11 107 lb (48.535 kg) (10.76%*)   * Growth percentiles are based on CDC 2-20 Years data.      Assessment & Plan:  New breast nodule   Mom hx of breast cancer  Disc options to get [place] today/  And further eval depending on results.   ADHD:  On stimulant meds using IR more than xr  On school days   And thus runs out.   Disc preference to only use one med but issues with class schedule . Certainly with weight loss would not increase the ir until better weight and other issues ironed out.  More may cause  more se.   Disc using med as a 15 plus 5 or 10 and 10 redosing .   rx for 60 ir at present .   Weight loss  Concerning as so low  But some from habits  Not eating before first dose   Counseled. importance fo this and how poor attention aggravated by poor nutrition and hydration. Denies specific eating disorder compulsive behavior .   Told she has dysthymia  Apparently ok today  fam has had better response to ssris  Had se of wellbutrin  Consider if appropriate.   Plan labs metabolic thyroid  ua  Plan ov in about a month when back home needs to gain weight even consider  Nutrition intervention if needed.  Suggest eat before any meds increase fluids  Is probably fluid depleted   Prolonged visit and coordination of care  Total visit 20mins+ > 50% spent counseling and coordinating care

## 2012-07-30 ENCOUNTER — Encounter: Payer: Self-pay | Admitting: Internal Medicine

## 2012-07-30 DIAGNOSIS — E876 Hypokalemia: Secondary | ICD-10-CM | POA: Insufficient documentation

## 2012-07-30 LAB — PAP IG W/ RFLX HPV ASCU

## 2012-08-03 DIAGNOSIS — R636 Underweight: Secondary | ICD-10-CM | POA: Insufficient documentation

## 2012-08-03 DIAGNOSIS — R634 Abnormal weight loss: Secondary | ICD-10-CM | POA: Insufficient documentation

## 2012-08-03 DIAGNOSIS — Z8659 Personal history of other mental and behavioral disorders: Secondary | ICD-10-CM | POA: Insufficient documentation

## 2012-10-10 ENCOUNTER — Encounter (INDEPENDENT_AMBULATORY_CARE_PROVIDER_SITE_OTHER): Payer: Self-pay | Admitting: Surgery

## 2012-10-10 ENCOUNTER — Ambulatory Visit (INDEPENDENT_AMBULATORY_CARE_PROVIDER_SITE_OTHER): Payer: Commercial Managed Care - PPO | Admitting: Surgery

## 2012-10-10 VITALS — BP 96/60 | HR 72 | Temp 97.7°F | Resp 16 | Ht 65.0 in | Wt 111.0 lb

## 2012-10-10 DIAGNOSIS — N63 Unspecified lump in unspecified breast: Secondary | ICD-10-CM

## 2012-10-10 NOTE — Progress Notes (Addendum)
Re:   Cynthia Sanchez DOB:   1991/03/10 MRN:   [id number]  ASSESSMENT AND PLAN: 1.  Left breast mass  Probable benign fibrous breast tissue.  We talked about continued observation vs core biopsy vs open biopsy.  What I see today on [place] is less distinct than what was seen in October 2013.  She wants to under go a core biopsy for reassurance.  Follow up will depend on path report.    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  This can be observed unless it changes significantly in size.  Discussed with mother.  DN  10/31/2012.]  2.  ADHD 3.  History of migraines.  Has problems every couple of months.  Chief Complaint  Patient presents with  . New Evaluation    eval breast mass   REFERRING PHYSICIAN: Lorretta Harp, MD  HISTORY OF PRESENT ILLNESS: Cynthia Sanchez is a 21 y.o. (DOB: [birth year])  white female whose primary care physician is Lorretta Harp, MD and comes to me today for left breast mass.   She noticed a lump in her left breast  first in September, 2013, when she had lost some weight.  This prompted a [place] on 07/29/2012 - 1.7 x 1.6 cm hypoechoic mass, plan repeat US in 6 months.   The mass is less noticeable now, but she said that she has put on 15 pounds since September.  She has no nipple discharge, no prior breast history, or no other complaints.    Her mother, Cynthia Sanchez,  is a patient of mine and had breast cancer in around 1998.  Her (Tina's) grandmother (mother's mother) had breast cancer, Tina's aunt (mother's sister) had breast cancer, and Tina's cousin (mother's sister's daughter) had breast cancer at age 31.  She was tested for BRCA and was negative.  She also has a ?nephew who has melanoma.  I talked to Cynthia Sanchez about our "high risk" breast clinic.  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  She is going to a talk on genetics and see if that answers her questions.    Past Medical History  Diagnosis Date    . ADHD (attention deficit hyperactivity disorder)   . History of chicken pox   . Migraines   . Migraine       Past Surgical History  Procedure Date  . Acl repaired 2009    left  . Tonsillectomy 2008  . Adenoidectomy   . Tonsillectomy and adenoidectomy   . Anterior cruciate ligament repair       Current Outpatient Prescriptions  Medication Sig Dispense Refill  . amphetamine-dextroamphetamine (ADDERALL) 10 MG tablet Take 1 tablet (10 mg total) by mouth daily.  60 tablet  0  . Biotin 5 MG TABS Take by mouth.      . Melatonin 5 MG CAPS Take by mouth.      . norethindrone-ethinyl estradiol (JUNEL FE,GILDESS FE,LOESTRIN FE) 1-20 MG-MCG tablet Take 1 tablet by mouth daily. Take first 3 wks of each pack continuously x 3packs then week of placebo pills.  (May also do x 4packs).  3 Package  5  . amphetamine-dextroamphetamine (ADDERALL XR) 25 MG 24 hr capsule Take 1 capsule (25 mg total) by mouth every morning.  90 capsule  0  . tiZANidine (ZANAFLEX) 4 MG capsule Take 4 mg by mouth 3 (three) times daily.            Allergies  Allergen Reactions  . Latex  REVIEW OF SYSTEMS: Skin:  No history of rash.  No history of abnormal moles. Infection:  No history of hepatitis or HIV.  No history of MRSA. Neurologic:  Has occasional migraine HA's (about 2 x per month). Cardiac:  No history of hypertension. No history of heart disease.  No history of prior cardiac catheterization.  No history of seeing a cardiologist. Pulmonary:  Had a pneumothorax about a year ago.  Treated without surgery.  Endocrine:  No diabetes. No thyroid disease. Gastrointestinal:  Had weight loss issues early this year.  Better now. Urologic:  No history of kidney stones.  No history of bladder infections. Musculoskeletal:  No history of joint or back disease. Hematologic:  No bleeding disorder.  No history of anemia.  Not anticoagulated. Psycho-social:  The patient is oriented.   ADHD.  SOCIAL and FAMILY  HISTORY: Unmarried. Mother, Cynthia Sanchez, with patient.  Cynthia Sanchez, who had breast cancer in 1998, saw Dr. Truett Perna. She is a Holiday representative at [employer].  Working on public health studies degree - she wants to be a PA.  PHYSICAL EXAM: BP 96/60  Pulse 72  Temp 97.7 F (36.5 C)  Resp 16  Ht 5\' 5"  (1.651 m)  Wt 111 lb (50.349 kg)  BMI 18.47 kg/m2  General: Thin WN WF who is alert and generally healthy appearing.  HEENT: Normal. Pupils equal. Neck: Supple. No mass.  No thyroid mass. Lymph Nodes:  No supraclavicular or cervical nodes.  Breast:     Right - lumpy breast, but no mass  Left - vague mass or plate of breast tissue at 12 o'clock, about 2 cm from areola.  Extremities:  Good strength and ROM  in upper and lower extremities. Has bandaid on right hand from getting splinter out of hand. Neurologic:  Grossly intact to motor and sensory function. Psychiatric: Has normal mood and affect.  [place] in office:  Vague 1.2 x 0.6 cm minimally hypodense area at 12 o'clock, 1.0 x 0.6 hyperdense area just medial to this - neither area looks suspicious or like a fibroadenoma.  I compared these to films done 07/29/2012 and the area is less prominent.  [Photos taken]  I think what I saw corresponds well with the [place] at the [facility] Center.  DATA REVIEWED: [place] from October 2013 reviewed.  Ovidio Kin, MD,  [facility] [address]  [address]   [place], [place]    [address] Phone:  [phone] FAX:  [phone]

## 2012-10-28 ENCOUNTER — Encounter (INDEPENDENT_AMBULATORY_CARE_PROVIDER_SITE_OTHER): Payer: Self-pay

## 2012-10-30 ENCOUNTER — Ambulatory Visit
Admission: RE | Admit: 2012-10-30 | Discharge: 2012-10-30 | Disposition: A | Discharge status: Home / Self Care | Payer: 59 | Source: Ambulatory Visit | Attending: Surgery | Admitting: Surgery

## 2012-10-30 ENCOUNTER — Other Ambulatory Visit: Payer: 59

## 2012-10-31 ENCOUNTER — Encounter: Payer: Self-pay | Admitting: Internal Medicine

## 2012-12-15 ENCOUNTER — Telehealth: Payer: Self-pay | Admitting: Internal Medicine

## 2012-12-15 NOTE — Telephone Encounter (Signed)
Pt's mother calling to request a refill on pt's Adderall:amphetamine-dextroamphetamine (ADDERALL XR) 25 MG 24 hr capsule, and amphetamine-dextroamphetamine (ADDERALL) 10 MG tablet. Please call mom when ready for pick up.

## 2012-12-16 ENCOUNTER — Other Ambulatory Visit: Payer: Self-pay | Admitting: Family Medicine

## 2012-12-16 MED ORDER — AMPHETAMINE-DEXTROAMPHETAMINE 10 MG PO TABS: 10.0000 mg | ORAL_TABLET | Freq: Every day | ORAL | Status: DC

## 2012-12-16 MED ORDER — AMPHETAMINE-DEXTROAMPHET ER 25 MG PO CP24: 25.0000 mg | ORAL_CAPSULE | ORAL | Status: DC

## 2012-12-16 NOTE — Telephone Encounter (Signed)
Patient's mother notified to pick up at the front desk. 

## 2012-12-16 NOTE — Telephone Encounter (Signed)
Printed for signature

## 2012-12-30 ENCOUNTER — Ambulatory Visit (INDEPENDENT_AMBULATORY_CARE_PROVIDER_SITE_OTHER): Payer: 59 | Admitting: Internal Medicine

## 2012-12-30 ENCOUNTER — Encounter: Payer: Self-pay | Admitting: Internal Medicine

## 2012-12-30 ENCOUNTER — Other Ambulatory Visit: Payer: Self-pay | Admitting: Family Medicine

## 2012-12-30 VITALS — BP 120/60 | HR 90 | Temp 98.6°F | Wt 111.0 lb

## 2012-12-30 DIAGNOSIS — F909 Attention-deficit hyperactivity disorder, unspecified type: Secondary | ICD-10-CM

## 2012-12-30 DIAGNOSIS — R636 Underweight: Secondary | ICD-10-CM

## 2012-12-30 DIAGNOSIS — N63 Unspecified lump in unspecified breast: Secondary | ICD-10-CM

## 2012-12-30 DIAGNOSIS — R002 Palpitations: Secondary | ICD-10-CM

## 2012-12-30 DIAGNOSIS — Z79899 Other long term (current) drug therapy: Secondary | ICD-10-CM

## 2012-12-30 DIAGNOSIS — R51 Headache: Secondary | ICD-10-CM

## 2012-12-30 DIAGNOSIS — R634 Abnormal weight loss: Secondary | ICD-10-CM

## 2012-12-30 MED ORDER — AMPHETAMINE-DEXTROAMPHETAMINE 10 MG PO TABS: ORAL_TABLET | ORAL | Status: DC

## 2012-12-30 MED ORDER — AMPHETAMINE-DEXTROAMPHET ER 25 MG PO CP24: 25.0000 mg | ORAL_CAPSULE | ORAL | Status: DC

## 2012-12-30 NOTE — Progress Notes (Addendum)
Chief Complaint  Patient presents with  . Follow-up    Meds    HPI: Chief Complaint  Patient presents with  . Follow-up    Meds    HPI: Patient comes in today for follow up of  multiple medical problems.  Med management ADHD; taking  xr  25 every am on weekdays and ocass weekends  And then  ocass IR   When wearing off.    2-3  times .   WEight  Trying to do better eating  In am with medication  Palpitations off an on  Saw  Cards  Had holter monitor  Done  In [place] .   No recurrence when on monitor but supposed to FU .  Sleep has issues stills  Worse if takes a break from adderall.  New generic adderall and older  Generic.   Left over.   New generic adderall allsemester.   No   Med or not.  Junior  At [employer] well  . Some stress   Migraines;    2 recently  Last one    Feb 14   .   Bad one.  And last Thursday  . ? Trigger  Stress     Denies sig anxiety and depression . Had a  Back pain and left arm pain that concerned her but went away in short time  No sob cough Tends to limit her exercise runny concern that pulse may go too fast.  Since  l ast visit she had breast lump  Biopsy  dx fibroadenoma   Has note form breast center about need for fu [place]   ? Can we arrange this?  ROS: See pertinent positives and negatives per HPI. No syncope bleeding   Vision changes  Past Medical History  Diagnosis Date  . ADHD (attention deficit hyperactivity disorder)   . History of chicken pox   . Migraines   . Migraine     Family History  Problem Relation Age of Onset  . Breast cancer Mother   . ADD / ADHD Mother   . Hyperlipidemia Mother   . Cancer Mother   . Asthma Father   . Migraines Father   . Pulmonary embolism Maternal Uncle     History   Social History  . Marital Status: Single    Spouse Name: N/A    Number of Children: N/A  . Years of Education: N/A   Social History Main Topics  . Smoking status: Never Smoker   . Smokeless tobacco: None  . Alcohol Use: No  . Drug  Use: No     Comment: experimented,doesn't do it now  . Sexually Active: None   Other Topics Concern  . None   Social History Narrative   HH of 3   Neg ets   Not a vegan   Exercise es   Neg tad    ECU wants to be Physc assist. Sleep interrupted   Father MD and Mom RN   Light sleeper    Outpatient Encounter Prescriptions as of 12/30/2012  Medication Sig Dispense Refill  . amphetamine-dextroamphetamine (ADDERALL XR) 25 MG 24 hr capsule Take 1 capsule (25 mg total) by mouth every morning.  90 capsule  0  . amphetamine-dextroamphetamine (ADDERALL) 10 MG tablet TAKE 1 po   In afternoon for studies if needed  45 tablet  0  . Biotin 5 MG TABS Take by mouth.      . Melatonin 5 MG CAPS Take by  mouth.      . norethindrone-ethinyl estradiol (JUNEL FE,GILDESS FE,LOESTRIN FE) 1-20 MG-MCG tablet Take 1 tablet by mouth daily. Take first 3 wks of each pack continuously x 3packs then week of placebo pills.  (May also do x 4packs).  3 Package  5  . [DISCONTINUED] amphetamine-dextroamphetamine (ADDERALL XR) 25 MG 24 hr capsule Take 1 capsule (25 mg total) by mouth every morning.  30 capsule  0  . [DISCONTINUED] amphetamine-dextroamphetamine (ADDERALL) 10 MG tablet Take 1 tablet (10 mg total) by mouth daily.  30 tablet  0  . [DISCONTINUED] tiZANidine (ZANAFLEX) 4 MG capsule Take 4 mg by mouth 3 (three) times daily.         No facility-administered encounter medications on file as of 12/30/2012.    EXAM:  BP 120/60  Pulse 90  Temp(Src) 98.6 F (37 C) (Oral)  Wt 111 lb (50.349 kg)  BMI 18.47 kg/m2  SpO2 98%  LMP 12/05/2012  Body mass index is 18.47 kg/(m^2). Wt Readings from Last 3 Encounters:  12/30/12 111 lb (50.349 kg)  10/10/12 111 lb (50.349 kg)  07/29/12 101 lb (45.813 kg)    GENERAL: vitals reviewed and listed above, alert, oriented, appears well hydrated and in no acute distress lender WF   HEENT: atraumatic, conjunctiva  clear, no obvious abnormalities on inspection of external  nose and ears tms intact  OP : no lesion edema or exudate   NECK: no obvious masses on inspection palpation no bruits supple no adenopathy  LUNGS: clear to auscultation bilaterally, no wheezes, rales or rhonchi, good air movement  CV: HRRR, no clubbing cyanosis or  peripheral edema nl cap refill pulse rate is 88 and regular Abdomen soft without megaly guarding or rebound MS: moves all extremities without noticeable focal  abnormality  PSYCH: pleasant and cooperative, no obvious depression or anxiety good eye contact no tremor neurologic exam appears grossly normal  ASSESSMENT AND PLAN:  Discussed the following assessment and plan:  ADHD - good response to med  takes xr most day and other addon  about couple times week at most  - Plan: amphetamine-dextroamphetamine (ADDERALL) 10 MG tablet  Weight loss, unintentional - better continue gained 10 # - Plan: amphetamine-dextroamphetamine (ADDERALL) 10 MG tablet  Underweight - Plan: amphetamine-dextroamphetamine (ADDERALL) 10 MG tablet  Breast lump on left side at 12 o'clock position - maternal breast cancer hx    hab biopsy dr Ezzard Standing benign - Plan: amphetamine-dextroamphetamine (ADDERALL) 10 MG tablet  Headache - Plan: amphetamine-dextroamphetamine (ADDERALL) 10 MG tablet  Palpitation - Under evaluation cardiology [place] had Holter.   Medication management Continue attention to eating her resting pulse rate is high normal 98 range she is possibly limited her aerobic activity because of concern that her heart will go to fast. Discussed side effects of her stimulant medication and eating habits etc. She should discuss this also with cardiologist in [place]. -Patient advised to return or notify health care team  if symptoms worsen or persist or new concerns arise.  Patient Instructions  rx for   Breast  Ultrasound ordered put in however discuss with Dr. Allene Pyo office her breast center if needed and followup  Continue   Medication.  As discussed    Have the cardiologist in [place]  Address the concern about fast heart rate when running and exercising.  alse send [place] a copy of eval for our records.   OV in 6 months or so or as needed.    Neta Mends. Panosh M.D.  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 This was  found to be concordant by Dr. Lindi Adie. Pathology was relayed to  the patient by telephone. The patient reported doing well after the  biopsy. Post biopsy instructions were reviewed and her questions  were answered. She was encouraged to call [facility] for any additional concerns. She was asked to  continue with self breast examinations and to return for screening  mammography at age 18.  Pathology results are dictated by Sonnie Alamo RN, BSN on January

## 2012-12-30 NOTE — Patient Instructions (Addendum)
rx for   Breast  Ultrasound ordered put in however discuss with Dr. Allene Pyo office her breast center if needed and followup  Continue   Medication. As discussed    Have the cardiologist in [place]  Address the concern about fast heart rate when running and exercising.  alse send [place] a copy of eval for our records.   OV in 6 months or so or as needed.

## 2013-03-11 ENCOUNTER — Telehealth: Payer: Self-pay | Admitting: Internal Medicine

## 2013-03-11 MED ORDER — NORETHIN ACE-ETH ESTRAD-FE 1-20 MG-MCG PO TABS: 1.0000 | ORAL_TABLET | Freq: Every day | ORAL | Status: DC

## 2013-03-11 NOTE — Telephone Encounter (Signed)
Pt needs 6 packets of norethindrone-ethinyl estradiol (JUNEL FE,GILDESS FE,LOESTRIN FE) 1-20 MG-MCG tablet Pt is traveling out of the country, usually gets 3, but needs 6.  Pharm: [facility]/ [id number]

## 2013-03-11 NOTE — Telephone Encounter (Signed)
Sent by e-scribe. 

## 2013-07-29 ENCOUNTER — Telehealth: Payer: Self-pay | Admitting: Internal Medicine

## 2013-07-29 DIAGNOSIS — R634 Abnormal weight loss: Secondary | ICD-10-CM

## 2013-07-29 DIAGNOSIS — F909 Attention-deficit hyperactivity disorder, unspecified type: Secondary | ICD-10-CM

## 2013-07-29 DIAGNOSIS — R636 Underweight: Secondary | ICD-10-CM

## 2013-07-29 DIAGNOSIS — R51 Headache: Secondary | ICD-10-CM

## 2013-07-29 NOTE — Telephone Encounter (Signed)
Pt needs new rxs generic adderall xr 25 mg #90 and generic adderall 10 mg #90 °

## 2013-07-29 NOTE — Telephone Encounter (Signed)
This patient is past due for her controlled medication check.  She will need to make an appt with [facility].  I will then check to see if [facility] will fill it until she comes in.  Please make an appt with the pt.  Thanks!!

## 2013-07-30 NOTE — Telephone Encounter (Signed)
Lm on mother cell to callback

## 2013-07-30 NOTE — Telephone Encounter (Signed)
Pt mother stopped by, and stated that the patient will not be back from school until christmas break. She said that she would have the pt call to try to schedule for a Friday afternoon. FYI.

## 2013-07-31 MED ORDER — AMPHETAMINE-DEXTROAMPHET ER 25 MG PO CP24: 25.0000 mg | ORAL_CAPSULE | ORAL | Status: DC

## 2013-07-31 MED ORDER — AMPHETAMINE-DEXTROAMPHETAMINE 10 MG PO TABS: ORAL_TABLET | ORAL | Status: DC

## 2013-07-31 NOTE — Telephone Encounter (Signed)
Should we refill Adderall?  Please read below.

## 2013-07-31 NOTE — Telephone Encounter (Signed)
Call in to patient In the meantime talked to mom Cynthia Sanchez who states that she did have a final cardiology evaluation and there was no contraindication to taking Adderall. She's pretty much taking the long-acting most days but the short acting on days where she doesn't have long classes. Her weight is good.  She is at [employer] in [place]   Mom will tell her and will tell her when she calls to see if she can come in on a Friday afternoon to be seen for med check before the prescription runs out  30 of each given

## 2013-08-04 ENCOUNTER — Other Ambulatory Visit (HOSPITAL_COMMUNITY): Payer: Self-pay | Admitting: Orthopedic Surgery

## 2013-08-04 DIAGNOSIS — M25562 Pain in left knee: Secondary | ICD-10-CM

## 2013-08-13 ENCOUNTER — Ambulatory Visit (HOSPITAL_COMMUNITY)
Admission: RE | Admit: 2013-08-13 | Discharge: 2013-08-13 | Disposition: A | Discharge status: Home / Self Care | Payer: 59 | Source: Ambulatory Visit | Attending: Orthopedic Surgery | Admitting: Orthopedic Surgery

## 2013-08-13 DIAGNOSIS — W219XXA Striking against or struck by unspecified sports equipment, initial encounter: Secondary | ICD-10-CM | POA: Insufficient documentation

## 2013-08-13 DIAGNOSIS — S83289A Other tear of lateral meniscus, current injury, unspecified knee, initial encounter: Secondary | ICD-10-CM | POA: Insufficient documentation

## 2013-08-13 DIAGNOSIS — M25569 Pain in unspecified knee: Secondary | ICD-10-CM | POA: Insufficient documentation

## 2013-08-13 DIAGNOSIS — Y9361 Activity, american tackle football: Secondary | ICD-10-CM | POA: Insufficient documentation

## 2013-08-13 DIAGNOSIS — M25562 Pain in left knee: Secondary | ICD-10-CM

## 2013-09-21 DIAGNOSIS — S83282A Other tear of lateral meniscus, current injury, left knee, initial encounter: Secondary | ICD-10-CM

## 2013-09-21 HISTORY — DX: Other tear of lateral meniscus, current injury, left knee, initial encounter: S83.282A

## 2013-10-08 ENCOUNTER — Other Ambulatory Visit: Payer: Self-pay | Admitting: Physician Assistant

## 2013-10-08 ENCOUNTER — Encounter (HOSPITAL_BASED_OUTPATIENT_CLINIC_OR_DEPARTMENT_OTHER): Payer: Self-pay | Admitting: *Deleted

## 2013-10-08 ENCOUNTER — Encounter: Payer: Self-pay | Admitting: Physician Assistant

## 2013-10-08 DIAGNOSIS — S83282A Other tear of lateral meniscus, current injury, left knee, initial encounter: Secondary | ICD-10-CM | POA: Insufficient documentation

## 2013-10-08 DIAGNOSIS — G43909 Migraine, unspecified, not intractable, without status migrainosus: Secondary | ICD-10-CM

## 2013-10-08 DIAGNOSIS — Z9889 Other specified postprocedural states: Secondary | ICD-10-CM

## 2013-10-08 DIAGNOSIS — F909 Attention-deficit hyperactivity disorder, unspecified type: Secondary | ICD-10-CM | POA: Insufficient documentation

## 2013-10-08 DIAGNOSIS — Z8619 Personal history of other infectious and parasitic diseases: Secondary | ICD-10-CM | POA: Insufficient documentation

## 2013-10-08 NOTE — Pre-Procedure Instructions (Signed)
Echo req. from Dr. Rosezella [place] office.

## 2013-10-08 NOTE — Pre-Procedure Instructions (Signed)
Discussed hx. with Dr. Ivin Booty; pt. OK to come for surgery.

## 2013-10-08 NOTE — H&P (Signed)
Cynthia Sanchez is an 22 y.o. female.   Chief Complaint: left knee pain HPI: Cynthia Sanchez is a 21 year old who injured her left knee playing flag football 3 weeks ago. She had a twisting pivoting injury to the left knee with significant pain although only mild swelling. She comes in for evaluation. She had a left knee hamstring autograft ACL reconstruction and lateral meniscus repair 5 years ago and was doing well until this recent injury. She is unable to twist or turn without some pain. She has had persistent swelling as well.  Past Medical History  Diagnosis Date  . ADHD (attention deficit hyperactivity disorder)   . History of chicken pox   . Migraines   . Tear of lateral meniscus of left knee   . S/P ACL reconstruction     Past Surgical History  Procedure Laterality Date  . Tonsillectomy  2008  . Adenoidectomy    . Tonsillectomy and adenoidectomy    . Knee arthroscopy w/ acl reconstruction and epiphyseal hamstring graft  03-02-2008    Family History  Problem Relation Age of Onset  . Breast cancer Mother   . ADD / ADHD Mother   . Hyperlipidemia Mother   . Cancer Mother   . Asthma Father   . Migraines Father   . Pulmonary embolism Maternal Uncle    Social History:  reports that she has never smoked. She does not have any smokeless tobacco history on file. She reports that she does not drink alcohol or use illicit drugs.  Allergies:  Allergies  Allergen Reactions  . Latex    Current Outpatient Prescriptions on File Prior to Visit  Medication Sig Dispense Refill  . amphetamine-dextroamphetamine (ADDERALL XR) 25 MG 24 hr capsule Take 1 capsule (25 mg total) by mouth every morning.  30 capsule  0  . amphetamine-dextroamphetamine (ADDERALL) 10 MG tablet TAKE 1 po   In afternoon for studies if needed  30 tablet  0  . Biotin 5 MG TABS Take by mouth.      . Melatonin 5 MG CAPS Take by mouth.      . norethindrone-ethinyl estradiol (JUNEL FE,GILDESS FE,LOESTRIN FE) 1-20 MG-MCG tablet Take 1  tablet by mouth daily. Take first 3 wks of each pack continuously x 3packs then week of placebo pills.  (May also do x 4packs).  6 Package  0   No current facility-administered medications on file prior to visit.    (Not in a hospital admission)  No results found for this or any previous visit (from the past 48 hour(s)). No results found.  Review of Systems  Constitutional: Negative.   HENT: Negative.   Eyes: Negative.   Respiratory: Negative.   Cardiovascular: Negative.   Gastrointestinal: Negative.   Genitourinary: Negative.   Musculoskeletal: Positive for joint pain.       Left knee pain  Skin: Negative.   Neurological: Negative.   Endo/Heme/Allergies: Negative.   Psychiatric/Behavioral: Negative.     Height 5' 5" (1.651 m), weight 53.524 kg (118 lb). Physical Exam  Constitutional: She is oriented to person, place, and time. She appears well-developed and well-nourished.  HENT:  Head: Normocephalic and atraumatic.  Eyes: Conjunctivae and EOM are normal. Pupils are equal, round, and reactive to light.  Neck: Neck supple.  Cardiovascular: Normal rate and regular rhythm.   Respiratory: Effort normal and breath sounds normal.  GI: Soft.  Genitourinary:  Not pertinent to current symptomatology therefore not examined.  Musculoskeletal:  Examination of her left knee reveals   1 to 2+ effusion. She has mild medial joint line pain she has 1 to 2+ Lachman with an end point knee is stable to varus valgus and posterior stress with normal patella tracking and positive medial McMurray's. Exam of the right knee reveals full range of motion without pain swelling weakness or instability. Vascular exam: pulses 2+ and symmetric.  Neurological: She is alert and oriented to person, place, and time.  Skin: Skin is warm and dry.  Psychiatric: She has a normal mood and affect. Her behavior is normal.     Assessment Patient Active Problem List   Diagnosis Date Noted  . Tear of lateral  meniscus of left knee   . S/P ACL reconstruction   . Migraines   . History of chicken pox   . ADHD (attention deficit hyperactivity disorder)   . Palpitation 12/30/2012  . Medication management 12/30/2012  . 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 Weight loss, unintentional 08/03/2012  . Underweight 08/03/2012  . Hypokalemia 07/30/2012  . Breast lump on left side at 12 o'clock position  us 10 13 follow 07/29/2012  . 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 Pleuritic chest pain 03/27/2011  . Chest pain 03/27/2011  . History of ongoing treatment with hormonal therapy 03/27/2011  . HEADACHE 04/14/2010  . ORTHOSTATIC DIZZINESS 08/18/2009  . TACHYCARDIA 08/18/2009  . ADHD 04/01/2009  . MIGRAINE HEADACHE 04/01/2009  . 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 IRREGULAR MENSES 04/01/2009    Plan I spoke to Amiri and her father today concerning her left knee MRI  that revealed a lateral meniscus tear. Her ACL graft is intact. She is 5 years status post ACL hamstring reconstruction and lateral meniscus repair but this appears to be a new lateral meniscus tear. Most of her pain is medial even though she has a lateral meniscus tear. I recommend a left knee arthroscopy with partial lateral meniscectomy.  The risks, benefits, and possible complications of the procedure were discussed in detail with the patient.  The patient and her father are without question.  Hatley Henegar J 10/08/2013, 9:55 AM    

## 2013-10-09 ENCOUNTER — Encounter (HOSPITAL_BASED_OUTPATIENT_CLINIC_OR_DEPARTMENT_OTHER)
Admission: RE | Disposition: A | Discharge status: Home / Self Care | Payer: Self-pay | Source: Ambulatory Visit | Attending: Orthopedic Surgery

## 2013-10-09 ENCOUNTER — Ambulatory Visit (HOSPITAL_BASED_OUTPATIENT_CLINIC_OR_DEPARTMENT_OTHER)
Admission: RE | Admit: 2013-10-09 | Discharge: 2013-10-09 | Disposition: A | Discharge status: Home / Self Care | Payer: 59 | Source: Ambulatory Visit | Attending: Orthopedic Surgery | Admitting: Orthopedic Surgery

## 2013-10-09 ENCOUNTER — Encounter (HOSPITAL_BASED_OUTPATIENT_CLINIC_OR_DEPARTMENT_OTHER): Payer: 59 | Admitting: Anesthesiology

## 2013-10-09 ENCOUNTER — Encounter (HOSPITAL_BASED_OUTPATIENT_CLINIC_OR_DEPARTMENT_OTHER): Payer: Self-pay | Admitting: Anesthesiology

## 2013-10-09 ENCOUNTER — Ambulatory Visit (HOSPITAL_BASED_OUTPATIENT_CLINIC_OR_DEPARTMENT_OTHER): Payer: 59 | Admitting: Anesthesiology

## 2013-10-09 DIAGNOSIS — W219XXA Striking against or struck by unspecified sports equipment, initial encounter: Secondary | ICD-10-CM | POA: Insufficient documentation

## 2013-10-09 DIAGNOSIS — G43909 Migraine, unspecified, not intractable, without status migrainosus: Secondary | ICD-10-CM | POA: Insufficient documentation

## 2013-10-09 DIAGNOSIS — Y9362 Activity, american flag or touch football: Secondary | ICD-10-CM | POA: Insufficient documentation

## 2013-10-09 DIAGNOSIS — S83289A Other tear of lateral meniscus, current injury, unspecified knee, initial encounter: Secondary | ICD-10-CM | POA: Insufficient documentation

## 2013-10-09 DIAGNOSIS — S83282A Other tear of lateral meniscus, current injury, left knee, initial encounter: Secondary | ICD-10-CM | POA: Diagnosis present

## 2013-10-09 DIAGNOSIS — F909 Attention-deficit hyperactivity disorder, unspecified type: Secondary | ICD-10-CM | POA: Diagnosis present

## 2013-10-09 DIAGNOSIS — Z9889 Other specified postprocedural states: Secondary | ICD-10-CM

## 2013-10-09 HISTORY — DX: Personal history of other specified conditions: Z87.898

## 2013-10-09 HISTORY — DX: Dental restoration status: Z98.811

## 2013-10-09 HISTORY — DX: Other specified cardiac arrhythmias: I49.8

## 2013-10-09 HISTORY — PX: KNEE ARTHROSCOPY WITH LATERAL MENISECTOMY: SHX6193

## 2013-10-09 HISTORY — DX: Reserved for concepts with insufficient information to code with codable children: IMO0002

## 2013-10-09 HISTORY — DX: Chronic migraine without aura, not intractable, without status migrainosus: G43.709

## 2013-10-09 SURGERY — ARTHROSCOPY, KNEE, WITH LATERAL MENISCECTOMY
Anesthesia: General | Site: Knee | Laterality: Left

## 2013-10-09 MED ORDER — MIDAZOLAM HCL 2 MG/2ML IJ SOLN
INTRAMUSCULAR | Status: AC
  Filled 2013-10-09: qty 2

## 2013-10-09 MED ORDER — FENTANYL CITRATE 0.05 MG/ML IJ SOLN
50.0000 ug | INTRAMUSCULAR | Status: DC | PRN
  Administered 2013-10-09: 100 ug via INTRAVENOUS

## 2013-10-09 MED ORDER — SODIUM CHLORIDE 0.9 % IR SOLN
Status: DC | PRN
  Administered 2013-10-09: 3000 mL

## 2013-10-09 MED ORDER — ONDANSETRON HCL 4 MG/2ML IJ SOLN
INTRAMUSCULAR | Status: DC | PRN
  Administered 2013-10-09: 4 mg via INTRAVENOUS

## 2013-10-09 MED ORDER — MIDAZOLAM HCL 2 MG/ML PO SYRP: 12.0000 mg | ORAL_SOLUTION | Freq: Once | ORAL | Status: DC | PRN

## 2013-10-09 MED ORDER — PROMETHAZINE HCL 25 MG/ML IJ SOLN: 6.2500 mg | INTRAMUSCULAR | Status: DC | PRN

## 2013-10-09 MED ORDER — CHLORHEXIDINE GLUCONATE 4 % EX LIQD: 60.0000 mL | Freq: Once | CUTANEOUS | Status: DC

## 2013-10-09 MED ORDER — DEXAMETHASONE SODIUM PHOSPHATE 4 MG/ML IJ SOLN
INTRAMUSCULAR | Status: DC | PRN
  Administered 2013-10-09: 10 mg via INTRAVENOUS

## 2013-10-09 MED ORDER — HYDROCODONE-ACETAMINOPHEN 5-325 MG PO TABS: ORAL_TABLET | ORAL | Status: DC

## 2013-10-09 MED ORDER — CEFAZOLIN SODIUM-DEXTROSE 2-3 GM-% IV SOLR
INTRAVENOUS | Status: AC
  Filled 2013-10-09: qty 50

## 2013-10-09 MED ORDER — FENTANYL CITRATE 0.05 MG/ML IJ SOLN
INTRAMUSCULAR | Status: AC
  Filled 2013-10-09: qty 2

## 2013-10-09 MED ORDER — BUPIVACAINE-EPINEPHRINE PF 0.5-1:200000 % IJ SOLN
INTRAMUSCULAR | Status: DC | PRN
  Administered 2013-10-09: 75 mg

## 2013-10-09 MED ORDER — CEFAZOLIN SODIUM-DEXTROSE 2-3 GM-% IV SOLR
2.0000 g | INTRAVENOUS | Status: AC
  Administered 2013-10-09: 2 g via INTRAVENOUS

## 2013-10-09 MED ORDER — FENTANYL CITRATE 0.05 MG/ML IJ SOLN
INTRAMUSCULAR | Status: AC
  Filled 2013-10-09: qty 4

## 2013-10-09 MED ORDER — OXYCODONE HCL 5 MG/5ML PO SOLN: 5.0000 mg | Freq: Once | ORAL | Status: DC | PRN

## 2013-10-09 MED ORDER — LACTATED RINGERS IV SOLN
INTRAVENOUS | Status: DC
  Administered 2013-10-09: 12:00:00 via INTRAVENOUS

## 2013-10-09 MED ORDER — OXYCODONE HCL 5 MG PO TABS: 5.0000 mg | ORAL_TABLET | Freq: Once | ORAL | Status: DC | PRN

## 2013-10-09 MED ORDER — LIDOCAINE HCL (CARDIAC) 20 MG/ML IV SOLN
INTRAVENOUS | Status: DC | PRN
  Administered 2013-10-09: 50 mg via INTRAVENOUS

## 2013-10-09 MED ORDER — MIDAZOLAM HCL 2 MG/2ML IJ SOLN
1.0000 mg | INTRAMUSCULAR | Status: DC | PRN
  Administered 2013-10-09: 2 mg via INTRAVENOUS

## 2013-10-09 MED ORDER — PROPOFOL 10 MG/ML IV BOLUS
INTRAVENOUS | Status: DC | PRN
  Administered 2013-10-09: 200 mg via INTRAVENOUS

## 2013-10-09 MED ORDER — FENTANYL CITRATE 0.05 MG/ML IJ SOLN
INTRAMUSCULAR | Status: DC | PRN
  Administered 2013-10-09: 50 ug via INTRAVENOUS

## 2013-10-09 MED ORDER — HYDROMORPHONE HCL PF 1 MG/ML IJ SOLN: 0.2500 mg | INTRAMUSCULAR | Status: DC | PRN

## 2013-10-09 MED ORDER — EPINEPHRINE HCL 1 MG/ML IJ SOLN
INTRAMUSCULAR | Status: DC | PRN
  Administered 2013-10-09: .9 mL

## 2013-10-09 MED ORDER — BUPIVACAINE-EPINEPHRINE PF 0.25-1:200000 % IJ SOLN
INTRAMUSCULAR | Status: AC
  Filled 2013-10-09: qty 30

## 2013-10-09 MED ORDER — LIDOCAINE-EPINEPHRINE (PF) 1.5 %-1:200000 IJ SOLN
INTRAMUSCULAR | Status: DC | PRN
  Administered 2013-10-09: 200 mg

## 2013-10-09 MED ORDER — EPINEPHRINE HCL 1 MG/ML IJ SOLN
INTRAMUSCULAR | Status: AC
  Filled 2013-10-09: qty 1

## 2013-10-09 SURGICAL SUPPLY — 45 items
BANDAGE ELASTIC 6 VELCRO ST LF (GAUZE/BANDAGES/DRESSINGS) ×2 IMPLANT
BLADE CUTTER GATOR 3.5 (BLADE) ×1 IMPLANT
BLADE GREAT WHITE 4.2 (BLADE) IMPLANT
BLADE SURG 15 STRL LF DISP TIS (BLADE) IMPLANT
BLADE SURG 15 STRL SS (BLADE)
BNDG COHESIVE 4X5 TAN STRL (GAUZE/BANDAGES/DRESSINGS) IMPLANT
CANISTER SUCT 3000ML (MISCELLANEOUS) IMPLANT
CANISTER SUCT LVC 12 LTR MEDI- (MISCELLANEOUS) IMPLANT
DRAPE ARTHROSCOPY W/POUCH 90 (DRAPES) ×2 IMPLANT
DURAPREP 26ML APPLICATOR (WOUND CARE) ×2 IMPLANT
GAUZE XEROFORM 1X8 LF (GAUZE/BANDAGES/DRESSINGS) ×2 IMPLANT
GLOVE BIO SURGEON STRL SZ7 (GLOVE) ×1 IMPLANT
GLOVE BIOGEL PI IND STRL 7.0 (GLOVE) ×1 IMPLANT
GLOVE BIOGEL PI IND STRL 7.5 (GLOVE) ×1 IMPLANT
GLOVE BIOGEL PI INDICATOR 7.0 (GLOVE) ×1
GLOVE BIOGEL PI INDICATOR 7.5 (GLOVE) ×1
GLOVE ECLIPSE 6.5 STRL STRAW (GLOVE) IMPLANT
GLOVE SKINSENSE NS SZ7.5 (GLOVE) ×1
GLOVE SKINSENSE STRL SZ7.5 (GLOVE) IMPLANT
GLOVE SS BIOGEL STRL SZ 7.5 (GLOVE) ×1 IMPLANT
GLOVE SUPERSENSE BIOGEL SZ 7.5 (GLOVE)
GLOVE SURG SS PI 6.5 STRL IVOR (GLOVE) ×1 IMPLANT
GOWN PREVENTION PLUS XLARGE (GOWN DISPOSABLE) ×2 IMPLANT
HOLDER KNEE FOAM BLUE (MISCELLANEOUS) ×2 IMPLANT
KNEE WRAP E Z 3 GEL PACK (MISCELLANEOUS) ×2 IMPLANT
NDL SAFETY ECLIPSE 18X1.5 (NEEDLE) ×2 IMPLANT
NEEDLE HYPO 18GX1.5 SHARP (NEEDLE) ×4
NEEDLE HYPO 22GX1.5 SAFETY (NEEDLE) IMPLANT
PACK ARTHROSCOPY DSU (CUSTOM PROCEDURE TRAY) ×2 IMPLANT
PACK BASIN DAY SURGERY FS (CUSTOM PROCEDURE TRAY) ×2 IMPLANT
PAD ALCOHOL SWAB (MISCELLANEOUS) ×2 IMPLANT
SET ARTHROSCOPY TUBING (MISCELLANEOUS) ×2
SET ARTHROSCOPY TUBING LN (MISCELLANEOUS) ×1 IMPLANT
SPONGE GAUZE 4X4 12PLY (GAUZE/BANDAGES/DRESSINGS) ×2 IMPLANT
SUCTION FRAZIER TIP 10 FR DISP (SUCTIONS) IMPLANT
SUT ETHILON 4 0 PS 2 18 (SUTURE) ×2 IMPLANT
SUT PROLENE 3 0 PS 2 (SUTURE) IMPLANT
SUT VIC AB 3-0 PS1 18 (SUTURE)
SUT VIC AB 3-0 PS1 18XBRD (SUTURE) IMPLANT
SYR 20CC LL (SYRINGE) IMPLANT
SYR 5ML LL (SYRINGE) ×1 IMPLANT
SYR TB 1ML LL NO SAFETY (SYRINGE) ×1 IMPLANT
TOWEL OR 17X24 6PK STRL BLUE (TOWEL DISPOSABLE) ×2 IMPLANT
WAND STAR VAC 90 (SURGICAL WAND) IMPLANT
WATER STERILE IRR 1000ML POUR (IV SOLUTION) ×2 IMPLANT

## 2013-10-09 NOTE — Anesthesia Procedure Notes (Addendum)
Procedure Name: LMA Insertion Performed by: York Grice Pre-anesthesia Checklist: Patient identified, Timeout performed, Emergency Drugs available, Suction available and Patient being monitored Patient Re-evaluated:Patient Re-evaluated prior to inductionOxygen Delivery Method: Circle system utilized Preoxygenation: Pre-oxygenation with 100% oxygen Intubation Type: IV induction Ventilation: Mask ventilation without difficulty LMA: LMA inserted LMA Size: 4.0   Performed by: York Grice Number of attempts: 1 Placement Confirmation: breath sounds checked- equal and bilateral and positive ETCO2 Tube secured with: Tape Dental Injury: Teeth and Oropharynx as per pre-operative assessment

## 2013-10-09 NOTE — Progress Notes (Signed)
Assisted Dr. Singer with left, knee block. Side rails up, monitors on throughout procedure. See vital signs in flow sheet. Tolerated Procedure well. 

## 2013-10-09 NOTE — Interval H&P Note (Signed)
History and Physical Interval Note:  10/09/2013 12:52 PM  Cynthia Sanchez  has presented today for surgery, with the diagnosis of LATERAL MENISCUS TEAR LEFT KNEE   The various methods of treatment have been discussed with the patient and family. After consideration of risks, benefits and other options for treatment, the patient has consented to  Procedure(s): LEFT KNEE ARTHROSCOPY WITH LATERAL MENISCECTOMY (Left) as a surgical intervention .  The patient's history has been reviewed, patient examined, no change in status, stable for surgery.  I have reviewed the patient's chart and labs.  Questions were answered to the patient's satisfaction.     Salvatore Marvel A

## 2013-10-09 NOTE — H&P (View-Only) (Signed)
Cynthia Sanchez is an 22 y.o. female.   Chief Complaint: left knee pain HPI: Cynthia Sanchez is a 22 year old who injured her left knee playing flag football 3 weeks ago. She had a twisting pivoting injury to the left knee with significant pain although only mild swelling. She comes in for evaluation. She had a left knee hamstring autograft ACL reconstruction and lateral meniscus repair 5 years ago and was doing well until this recent injury. She is unable to twist or turn without some pain. She has had persistent swelling as well.  Past Medical History  Diagnosis Date  . ADHD (attention deficit hyperactivity disorder)   . History of chicken pox   . Migraines   . Tear of lateral meniscus of left knee   . S/P ACL reconstruction     Past Surgical History  Procedure Laterality Date  . Tonsillectomy  2008  . Adenoidectomy    . Tonsillectomy and adenoidectomy    . Knee arthroscopy w/ acl reconstruction and epiphyseal hamstring graft  03-02-2008    Family History  Problem Relation Age of Onset  . Breast cancer Mother   . ADD / ADHD Mother   . Hyperlipidemia Mother   . Cancer Mother   . Asthma Father   . Migraines Father   . Pulmonary embolism Maternal Uncle    Social History:  reports that she has never smoked. She does not have any smokeless tobacco history on file. She reports that she does not drink alcohol or use illicit drugs.  Allergies:  Allergies  Allergen Reactions  . Latex    Current Outpatient Prescriptions on File Prior to Visit  Medication Sig Dispense Refill  . amphetamine-dextroamphetamine (ADDERALL XR) 25 MG 24 hr capsule Take 1 capsule (25 mg total) by mouth every morning.  30 capsule  0  . amphetamine-dextroamphetamine (ADDERALL) 10 MG tablet TAKE 1 po   In afternoon for studies if needed  30 tablet  0  . Biotin 5 MG TABS Take by mouth.      . Melatonin 5 MG CAPS Take by mouth.      . norethindrone-ethinyl estradiol (JUNEL FE,GILDESS FE,LOESTRIN FE) 1-20 MG-MCG tablet Take 1  tablet by mouth daily. Take first 3 wks of each pack continuously x 3packs then week of placebo pills.  (May also do x 4packs).  6 Package  0   No current facility-administered medications on file prior to visit.    (Not in a hospital admission)  No results found for this or any previous visit (from the past 48 hour(s)). No results found.  Review of Systems  Constitutional: Negative.   HENT: Negative.   Eyes: Negative.   Respiratory: Negative.   Cardiovascular: Negative.   Gastrointestinal: Negative.   Genitourinary: Negative.   Musculoskeletal: Positive for joint pain.       Left knee pain  Skin: Negative.   Neurological: Negative.   Endo/Heme/Allergies: Negative.   Psychiatric/Behavioral: Negative.     Height 5\' 5"  (1.651 m), weight 53.524 kg (118 lb). Physical Exam  Constitutional: She is oriented to person, place, and time. She appears well-developed and well-nourished.  HENT:  Head: Normocephalic and atraumatic.  Eyes: Conjunctivae and EOM are normal. Pupils are equal, round, and reactive to light.  Neck: Neck supple.  Cardiovascular: Normal rate and regular rhythm.   Respiratory: Effort normal and breath sounds normal.  GI: Soft.  Genitourinary:  Not pertinent to current symptomatology therefore not examined.  Musculoskeletal:  Examination of her left knee reveals  1 to 2+ effusion. She has mild medial joint line pain she has 1 to 2+ Lachman with an end point knee is stable to varus valgus and posterior stress with normal patella tracking and positive medial McMurray's. Exam of the right knee reveals full range of motion without pain swelling weakness or instability. Vascular exam: pulses 2+ and symmetric.  Neurological: She is alert and oriented to person, place, and time.  Skin: Skin is warm and dry.  Psychiatric: She has a normal mood and affect. Her behavior is normal.     Assessment Patient Active Problem List   Diagnosis Date Noted  . Tear of lateral  meniscus of left knee   . S/P ACL reconstruction   . Migraines   . History of chicken pox   . ADHD (attention deficit hyperactivity disorder)   . Palpitation 12/30/2012  . Medication management 12/30/2012  . 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 Weight loss, unintentional 08/03/2012  . Underweight 08/03/2012  . Hypokalemia 07/30/2012  . Breast lump on left side at 12 o'clock position  [place] 10 13 follow 07/29/2012  . 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 Pleuritic chest pain 03/27/2011  . Chest pain 03/27/2011  . History of ongoing treatment with hormonal therapy 03/27/2011  . HEADACHE 04/14/2010  . ORTHOSTATIC DIZZINESS 08/18/2009  . TACHYCARDIA 08/18/2009  . ADHD 04/01/2009  . MIGRAINE HEADACHE 04/01/2009  . 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 IRREGULAR MENSES 04/01/2009    Plan I spoke to Karan and her father today concerning her left knee MRI  that revealed a lateral meniscus tear. Her ACL graft is intact. She is 5 years status post ACL hamstring reconstruction and lateral meniscus repair but this appears to be a new lateral meniscus tear. Most of her pain is medial even though she has a lateral meniscus tear. I recommend a left knee arthroscopy with partial lateral meniscectomy.  The risks, benefits, and possible complications of the procedure were discussed in detail with the patient.  The patient and her father are without question.  [name] J 10/08/2013, 9:55 AM

## 2013-10-09 NOTE — Anesthesia Postprocedure Evaluation (Signed)
Anesthesia Post Note  Patient: Cynthia Sanchez  Procedure(s) Performed: Procedure(s) (LRB): LEFT KNEE ARTHROSCOPY WITH LATERAL MENISCECTOMY (Left)  Anesthesia type: general  Patient location: PACU  Post pain: Pain level controlled  Post assessment: Patient's Cardiovascular Status Stable  Last Vitals:  Filed Vitals:   10/09/13 1430  BP: 110/65  Pulse: 75  Temp:   Resp: 17    Post vital signs: Reviewed and stable  Level of consciousness: sedated  Complications: No apparent anesthesia complications

## 2013-10-09 NOTE — Transfer of Care (Signed)
Immediate Anesthesia Transfer of Care Note  Patient: Cynthia Sanchez  Procedure(s) Performed: Procedure(s): LEFT KNEE ARTHROSCOPY WITH LATERAL MENISCECTOMY (Left)  Patient Location: PACU  Anesthesia Type:General  Level of Consciousness: awake and sedated  Airway & Oxygen Therapy: Patient Spontanous Breathing and Patient connected to face mask oxygen  Post-op Assessment: Report given to PACU RN and Post -op Vital signs reviewed and stable  Post vital signs: Reviewed and stable  Complications: No apparent anesthesia complications

## 2013-10-09 NOTE — Anesthesia Preprocedure Evaluation (Signed)
Anesthesia Evaluation  Patient identified by MRN, date of birth, ID band Patient awake    Reviewed: Allergy & Precautions, H&P , NPO status , Patient's Chart, lab work & pertinent test results  Airway Mallampati: I TM Distance: >3 FB Neck ROM: full    Dental  (+) Teeth Intact and Dental Advidsory Given   Pulmonary neg pulmonary ROS,  breath sounds clear to auscultation        Cardiovascular negative cardio ROS  Rhythm:regular Rate:Normal     Neuro/Psych  Headaches, PSYCHIATRIC DISORDERS negative neurological ROS     GI/Hepatic negative GI ROS, Neg liver ROS,   Endo/Other  negative endocrine ROS  Renal/GU negative Renal ROS     Musculoskeletal   Abdominal   Peds  Hematology   Anesthesia Other Findings   Reproductive/Obstetrics negative OB ROS                           Anesthesia Physical Anesthesia Plan  ASA: I  Anesthesia Plan: General LMA   Post-op Pain Management:    Induction:   Airway Management Planned:   Additional Equipment:   Intra-op Plan:   Post-operative Plan:   Informed Consent: I have reviewed the patients History and Physical, chart, labs and discussed the procedure including the risks, benefits and alternatives for the proposed anesthesia with the patient or authorized representative who has indicated his/her understanding and acceptance.   Dental Advisory Given  Plan Discussed with: Anesthesiologist, CRNA and Surgeon  Anesthesia Plan Comments:         Anesthesia Quick Evaluation

## 2013-10-12 NOTE — Op Note (Signed)
[name], AMARA NO.:  [id number]  MEDICAL RECORD NO.:  [id number]  LOCATION:                               FACILITY:  [facility]  PHYSICIAN:  Elana Alm. Thurston Hole, M.D. DATE OF BIRTH:  1991-06-18  DATE OF PROCEDURE:  10/09/2013 DATE OF DISCHARGE:  10/09/2013                              OPERATIVE REPORT   PREOPERATIVE DIAGNOSIS:  Left knee lateral meniscus tear.  POSTOPERATIVE DIAGNOSIS:  Left knee lateral meniscus tear.  PROCEDURE:  Left knee examination under anesthesia, followed by arthroscopic partial lateral meniscectomy.  SURGEON:  Elana Alm. Thurston Hole, M.D.  ASSISTANT:  Kirstin Shepperson, PA-C.  ANESTHESIA:  General.  OPERATIVE TIME:  30 minutes.  COMPLICATIONS:  None.  INDICATION FOR PROCEDURE:  [name] is a 22 year old who sustained a twisting injury to her left knee 3 months ago.  Exam and MRI has revealed a lateral meniscus tear.  Previous ACL reconstruction is intact.  She has failed conservative care and is now to undergo arthroscopy.  DESCRIPTION OF PROCEDURE:  Panayiota was brought to the operating room on October 09, 2013 after knee block was placed on holding by anesthesia. She was placed on the operative table on supine position.  She received antibiotics preoperatively for prophylaxis.  After being placed under general anesthesia, her left knee was examined.  She had full range of motion.  Knee was stable.  Ligamentous exam with normal patellar tracking.  Left leg was prepped using sterile DuraPrep and draped using sterile technique.  Time-out procedure was called, the correct left knee identified.  Initially, through an anterolateral portal, the arthroscope with a pump attached, was placed into an anteromedial portal, an arthroscopic probe was placed.  On initial inspection of medial compartment, the articular cartilage was normal.  Medial meniscus normal.  Intercondylar notch inspected.  The ACL graft was intact and stable.  PCL was stable.   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  Lateral meniscus where she had previously had meniscal repair, had partial tearing around this with a loose meniscal suture and this was removed and partial resection of the posterior horn tear and lateral horn tear of 20% was resected back to a stable rim.  The rest of the lateral meniscus remained intact of which 80-85% lateral meniscus remained intact.  Popliteus tendon was intact.  Patellofemoral joint articular cartilage was normal.  The patella tracked normally.  Medial and lateral gutters were free of pathology.  After this done, it was felt that all pathology had been satisfactorily addressed.  The instruments removed.  Portals closed with 3-0 nylon suture.  Sterile dressings were applied.  The patient awakened and taken to recovery room in stable condition.  FOLLOWUP CARE:  Merit to be followed as an outpatient on Norco for pain. Seen back in the office in a week for sutures out in followup.     Phyliss Hulick A. Thurston Hole, M.D.   ______________________________ Elana Alm. Thurston Hole, M.D.    RAW/MEDQ  D:  10/09/2013  T:  10/10/2013  Job:  [id number]

## 2013-10-13 ENCOUNTER — Encounter (HOSPITAL_BASED_OUTPATIENT_CLINIC_OR_DEPARTMENT_OTHER): Payer: Self-pay | Admitting: Orthopedic Surgery

## 2013-10-26 ENCOUNTER — Encounter: Payer: Self-pay | Admitting: Internal Medicine

## 2013-10-26 ENCOUNTER — Ambulatory Visit (INDEPENDENT_AMBULATORY_CARE_PROVIDER_SITE_OTHER): Payer: 59 | Admitting: Internal Medicine

## 2013-10-26 VITALS — BP 110/62 | HR 81 | Temp 98.6°F | Wt 119.0 lb

## 2013-10-26 DIAGNOSIS — F909 Attention-deficit hyperactivity disorder, unspecified type: Secondary | ICD-10-CM

## 2013-10-26 DIAGNOSIS — Z8679 Personal history of other diseases of the circulatory system: Secondary | ICD-10-CM

## 2013-10-26 DIAGNOSIS — M549 Dorsalgia, unspecified: Secondary | ICD-10-CM

## 2013-10-26 LAB — POCT URINALYSIS DIPSTICK
Bilirubin, UA: NEGATIVE
Glucose, UA: NEGATIVE
Leukocytes, UA: NEGATIVE
Nitrite, UA: NEGATIVE
RBC UA: NEGATIVE
SPEC GRAV UA: 1.015
Urobilinogen, UA: 0.2
pH, UA: 5

## 2013-10-26 NOTE — Patient Instructions (Signed)
Urine test . Is neg for blood and infection. Could be musculoskeletal pain but is quite severe for this .  Will contact you a bout ultrasound .

## 2013-10-26 NOTE — Progress Notes (Signed)
Pre visit review using our clinic review tool, if applicable. No additional management support is needed unless otherwise documented below in the visit note. Chief Complaint  Patient presents with  . Back Pain    HPI: Patient comes in today for SDA for  new problem evaluation.  Acute onset yesterday after sitting for a few minutes a severe shooting bilateral flank back pain. Lasted about 20 minutes went to her bed and became sore and tight. There was no fever nausea vomiting syncope respiratory distress. Pain level on a scale of 10 9/10 not as bad as her collapsed lung. Currently much better is just sore when she touches the lower back coughing and sneezing some discomfort.   Discussed with neighbor physician because parents were out of town and advised getting evaluated for possible kidney stone.  She's not had this pain before no recent injury.  Bowel habits no change although she did take Vicodin 3 or 4 times after her knee surgery in December.  Has infrequent bowel movements at baseline. No UTI symptoms.  ROS: See pertinent positives and negatives per HPI. Had surgery 12/22 Dr. Noemi Chapel left knee one day no prolonged bed rest. Because of intermittent tachycardia has been trying Vyvanse 20 mg +10  40 in am to titrate dose and avoid immediate release affects. Has followup visit end of the week goes back to school in 5 days. No history of renal stone hematuria. Past Medical History  Diagnosis Date  . ADHD (attention deficit hyperactivity disorder)   . Tear of lateral meniscus of left knee 09/2013  . Chronic migraine   . History of tachycardia     states echo was normal; was done in [place], [place]; no current med.  . Dental crowns present     x 2  . Sinus arrhythmia     Family History  Problem Relation Age of Onset  . Breast cancer Mother   . ADD / ADHD Mother   . Hyperlipidemia Mother   . Cancer Mother   . Asthma Father   . Migraines Father   . Pulmonary embolism Maternal  Uncle     History   Social History  . Marital Status: Single    Spouse Name: N/A    Number of Children: N/A  . Years of Education: N/A   Social History Main Topics  . Smoking status: Never Smoker   . Smokeless tobacco: Never Used  . Alcohol Use: Yes     Comment: occasionally  . Drug Use: No     Comment: experimented,doesn't do it now  . Sexual Activity: None   Other Topics Concern  . None   Social History Narrative   HH of 3   Neg ets   Not a vegan   Exercise es   Neg tad    ECU wants to be Physc assist. Sleep interrupted   Father MD and Mom RN   Light sleeper    Outpatient Encounter Prescriptions as of 10/26/2013  Medication Sig  . amphetamine-dextroamphetamine (ADDERALL) 10 MG tablet TAKE 1 po   In afternoon for studies if needed  . Biotin 5 MG TABS Take by mouth.  . Melatonin 5 MG CAPS Take by mouth.  . norethindrone-ethinyl estradiol (JUNEL FE,GILDESS FE,LOESTRIN FE) 1-20 MG-MCG tablet Take 1 tablet by mouth daily. Take first 3 wks of each pack continuously x 3packs then week of placebo pills.  (May also do x 4packs).  . [DISCONTINUED] HYDROcodone-acetaminophen (NORCO) 5-325 MG per tablet 1-2 tablets every 4-6  hrs as needed for pain    EXAM:  BP 110/62  Pulse 81  Temp(Src) 98.6 F (37 C) (Oral)  Wt 119 lb (53.978 kg)  SpO2 97%  LMP 10/12/2013  Body mass index is 19.8 kg/(m^2).  GENERAL: vitals reviewed and listed above, alert, oriented, appears well hydrated and in no acute distress HEENT: atraumatic, conjunctiva  clear, no obvious abnormalities on inspection of external nose and ears NECK: no obvious masses on inspection palpation no adenopathy LUNGS: clear to auscultation bilaterally, no wheezes, rales or rhonchi, good air movement CV: HRRR, no clubbing cyanosis or  peripheral edema nl cap refill  Abdomen:  Sof,t normal bowel sounds without hepatosplenomegaly, no guarding rebound or masses no CVA tenderness Points to lower lateral back as area of  currnet concern  No bony spine tenderness gait normal  MS: moves all extremities without noticeable focal  abnormality PSYCH: pleasant and cooperative, no obvious depression or anxiety  ASSESSMENT AND PLAN:  Discussed the following assessment and plan:  Back pain acute  - bilateral severe flank pain . no radiation consider ms renal US ordered  - Plan: POC Urinalysis Dipstick, US Renal  ADHD (attention deficit hyperactivity disorder)  Hx of supraventricular tachycardia - undwer manipulation of stimulant m,eds to avoid se . long acting vyvanse  keepus informed agree limiting IR meds  acute onset atypical  No radiation no infection or hematuria today.  Ketones reported to pt. Poss ms cause but unusual presentation no injury and no alarm sx on exam today  Renal US consider plain ls spine x ray.  -Patient advised to return or notify health care team  if symptoms worsen or persist or new concerns arise.  Patient Instructions  Urine test . Is neg for blood and infection. Could be musculoskeletal pain but is quite severe for this .  Will contact you a bout ultrasound .   Standley Brooking. Panosh M.D.

## 2013-10-27 ENCOUNTER — Telehealth: Payer: Self-pay | Admitting: Internal Medicine

## 2013-10-27 NOTE — Telephone Encounter (Signed)
Pt waiting to hear about appt for stat referral for renal ultrasound. pls advise. Pt would like a call back.

## 2013-10-28 ENCOUNTER — Ambulatory Visit
Admission: RE | Admit: 2013-10-28 | Discharge: 2013-10-28 | Disposition: A | Discharge status: Home / Self Care | Payer: 59 | Source: Ambulatory Visit | Attending: Internal Medicine | Admitting: Internal Medicine

## 2013-10-28 DIAGNOSIS — M549 Dorsalgia, unspecified: Secondary | ICD-10-CM

## 2013-10-28 NOTE — Telephone Encounter (Signed)
Pt notified of appt scheduled today at 3:30pm at [facility].

## 2013-10-29 ENCOUNTER — Telehealth: Payer: Self-pay | Admitting: Internal Medicine

## 2013-10-29 NOTE — Telephone Encounter (Signed)
Pt wanting to speak with you concerning ultasound

## 2013-10-30 ENCOUNTER — Other Ambulatory Visit: Payer: Self-pay | Admitting: Family Medicine

## 2013-10-30 ENCOUNTER — Other Ambulatory Visit (INDEPENDENT_AMBULATORY_CARE_PROVIDER_SITE_OTHER): Payer: 59

## 2013-10-30 DIAGNOSIS — R109 Unspecified abdominal pain: Secondary | ICD-10-CM

## 2013-10-30 DIAGNOSIS — R93429 Abnormal radiologic findings on diagnostic imaging of unspecified kidney: Secondary | ICD-10-CM

## 2013-10-30 DIAGNOSIS — R9389 Abnormal findings on diagnostic imaging of other specified body structures: Secondary | ICD-10-CM

## 2013-10-30 LAB — BASIC METABOLIC PANEL
BUN: 16 mg/dL (ref 6–23)
CHLORIDE: 104 meq/L (ref 96–112)
CO2: 27 mEq/L (ref 19–32)
CREATININE: 1.2 mg/dL (ref 0.4–1.2)
Calcium: 9.3 mg/dL (ref 8.4–10.5)
GFR: 60.24 mL/min (ref >60.00)
GLUCOSE: 87 mg/dL (ref 70–99)
Potassium: 3.9 mEq/L (ref 3.5–5.1)
Sodium: 138 mEq/L (ref 135–145)

## 2013-10-30 LAB — CBC WITH DIFFERENTIAL/PLATELET
BASOS PCT: 0.4 % (ref 0.0–3.0)
Basophils Absolute: 0 10*3/uL (ref 0.0–0.1)
EOS ABS: 0.1 10*3/uL (ref 0.0–0.7)
Eosinophils Relative: 1.8 % (ref 0.0–5.0)
HCT: 38.3 % (ref 36.0–46.0)
Hemoglobin: 13.3 g/dL (ref 12.0–15.0)
Lymphocytes Relative: 29.5 % (ref 12.0–46.0)
Lymphs Abs: 1.8 10*3/uL (ref 0.7–4.0)
MCHC: 34.8 g/dL (ref 30.0–36.0)
MCV: 90.9 fl (ref 78.0–100.0)
MONO ABS: 0.3 10*3/uL (ref 0.1–1.0)
Monocytes Relative: 5.1 % (ref 3.0–12.0)
Neutro Abs: 3.8 10*3/uL (ref 1.4–7.7)
Neutrophils Relative %: 63.2 % (ref 43.0–77.0)
PLATELETS: 252 10*3/uL (ref 150.0–400.0)
RBC: 4.21 Mil/uL (ref 3.87–5.11)
RDW: 12.7 % (ref 11.5–14.6)
WBC: 6 10*3/uL (ref 4.5–10.5)

## 2013-10-30 LAB — URINALYSIS, ROUTINE W REFLEX MICROSCOPIC
Bilirubin Urine: NEGATIVE
KETONES UR: NEGATIVE
Nitrite: NEGATIVE
RBC / HPF: NONE SEEN (ref 0–?)
Specific Gravity, Urine: 1.02 (ref 1.000–1.030)
Total Protein, Urine: NEGATIVE
URINE GLUCOSE: NEGATIVE
Urobilinogen, UA: 0.2 (ref 0.0–1.0)
pH: 5.5 (ref 5.0–8.0)

## 2013-10-30 NOTE — Telephone Encounter (Signed)
Spoke with the patient in the office today.  Explained results.  Put in lab work orders for [employer].

## 2013-10-31 LAB — PROTEIN / CREATININE RATIO, URINE
Creatinine, Urine: 129.4 mg/dL
PROTEIN CREATININE RATIO: 0.03 (ref <0.15)
TOTAL PROTEIN, URINE: 4 mg/dL

## 2013-11-05 ENCOUNTER — Telehealth: Payer: Self-pay | Admitting: Family Medicine

## 2013-11-05 DIAGNOSIS — R399 Unspecified symptoms and signs involving the genitourinary system: Secondary | ICD-10-CM | POA: Insufficient documentation

## 2013-11-05 NOTE — Telephone Encounter (Signed)
What diagnosis code do you want to use for nephrology referral? If patient cannot be seen in [place] on Monday than she will need a referral to [place].

## 2013-11-05 NOTE — Telephone Encounter (Signed)
Abnormal kidney test i put in problem list  Send all albs and ov related

## 2013-11-06 ENCOUNTER — Other Ambulatory Visit: Payer: Self-pay | Admitting: Family Medicine

## 2013-11-06 DIAGNOSIS — N289 Disorder of kidney and ureter, unspecified: Secondary | ICD-10-CM

## 2013-11-06 NOTE — Telephone Encounter (Signed)
Order placed in the system. 

## 2013-11-10 ENCOUNTER — Telehealth: Payer: Self-pay | Admitting: Internal Medicine

## 2013-11-10 NOTE — Telephone Encounter (Signed)
Pharmacy requesting refill approval for norethindrone-ethinyl estradiol (JUNEL FE,GILDESS FE,LOESTRIN FE) 1-20 MG-MCG tablet [facility] to [facility], fax # [phone]

## 2013-11-11 ENCOUNTER — Other Ambulatory Visit: Payer: Self-pay | Admitting: Family Medicine

## 2013-11-11 MED ORDER — NORETHIN ACE-ETH ESTRAD-FE 1-20 MG-MCG PO TABS: 1.0000 | ORAL_TABLET | Freq: Every day | ORAL | Status: DC

## 2013-11-11 NOTE — Telephone Encounter (Signed)
Ok to refill x 6 months  Make sure she is UTD on pap smear ( I think she had one in 2013 with gyne)

## 2013-11-11 NOTE — Telephone Encounter (Signed)
Will have to fax to the pharmacy.  [facility] is not in the system.

## 2013-11-11 NOTE — Telephone Encounter (Signed)
Patient has not had a physical.  Please advise.

## 2013-11-16 ENCOUNTER — Telehealth: Payer: Self-pay | Admitting: Family Medicine

## 2013-11-16 ENCOUNTER — Other Ambulatory Visit: Payer: Self-pay | Admitting: Family Medicine

## 2013-11-16 NOTE — Telephone Encounter (Signed)
Spoke to [place] at pharmacy.  He said her insurance will NOT pay for bc with iron.  She will need a new prescription for Junel none FE 1/20 and a separate iron supplement prescription to take in place of the placebo pills at the end of her pack.  Notified Zack that [facility] is out of the office this week.  He looked at his system and stated the patient should not need a refill anytime soon and there was no need to rush.  Informed his I will send this to [facility] and she will be back next week.

## 2013-11-17 NOTE — Telephone Encounter (Signed)
Error

## 2013-11-22 NOTE — Telephone Encounter (Signed)
It is fine to change to the plain junel 1/20  With out iron

## 2013-11-24 ENCOUNTER — Other Ambulatory Visit: Payer: Self-pay | Admitting: Family Medicine

## 2013-11-24 MED ORDER — NORETHINDRONE ACET-ETHINYL EST 1-20 MG-MCG PO TABS: 1.0000 | ORAL_TABLET | Freq: Every day | ORAL | Status: DC

## 2013-11-24 NOTE — Telephone Encounter (Signed)
Spoke to [place].  He notified me to send in #[phone] of new prescription for 3 month supply.  Sent by e-scribe.

## 2014-04-14 ENCOUNTER — Other Ambulatory Visit: Payer: Self-pay | Admitting: Internal Medicine

## 2014-04-16 NOTE — Telephone Encounter (Signed)
Ok x 6 months worth  Plan preventive visit to be completed before runs out of meds

## 2014-04-16 NOTE — Telephone Encounter (Signed)
Sent to the pharmacy by e-scribe. 

## 2014-05-19 ENCOUNTER — Other Ambulatory Visit: Payer: Self-pay | Admitting: Internal Medicine

## 2014-12-07 ENCOUNTER — Encounter: Payer: Self-pay | Admitting: Physician Assistant

## 2014-12-07 ENCOUNTER — Ambulatory Visit (INDEPENDENT_AMBULATORY_CARE_PROVIDER_SITE_OTHER): Payer: 59 | Admitting: Physician Assistant

## 2014-12-07 VITALS — BP 115/68 | HR 107 | Ht 65.0 in | Wt 113.0 lb

## 2014-12-07 DIAGNOSIS — D242 Benign neoplasm of left breast: Secondary | ICD-10-CM

## 2014-12-07 DIAGNOSIS — L731 Pseudofolliculitis barbae: Secondary | ICD-10-CM

## 2014-12-07 DIAGNOSIS — N6310 Unspecified lump in the right breast, unspecified quadrant: Secondary | ICD-10-CM

## 2014-12-07 DIAGNOSIS — Z23 Encounter for immunization: Secondary | ICD-10-CM

## 2014-12-07 DIAGNOSIS — F900 Attention-deficit hyperactivity disorder, predominantly inattentive type: Secondary | ICD-10-CM

## 2014-12-07 DIAGNOSIS — N63 Unspecified lump in breast: Secondary | ICD-10-CM

## 2014-12-07 DIAGNOSIS — G43111 Migraine with aura, intractable, with status migrainosus: Secondary | ICD-10-CM

## 2014-12-07 DIAGNOSIS — N6320 Unspecified lump in the left breast, unspecified quadrant: Secondary | ICD-10-CM

## 2014-12-07 DIAGNOSIS — Z803 Family history of malignant neoplasm of breast: Secondary | ICD-10-CM

## 2014-12-07 DIAGNOSIS — N6311 Unspecified lump in the right breast, upper outer quadrant: Secondary | ICD-10-CM | POA: Insufficient documentation

## 2014-12-07 MED ORDER — TETANUS-DIPHTH-ACELL PERTUSSIS 5-2.5-18.5 LF-MCG/0.5 IM SUSP: 0.5000 mL | Freq: Once | INTRAMUSCULAR | Status: DC

## 2014-12-07 MED ORDER — MUPIROCIN 2 % EX OINT: 1.0000 "application " | TOPICAL_OINTMENT | Freq: Two times a day (BID) | CUTANEOUS | Status: AC

## 2014-12-07 NOTE — Patient Instructions (Signed)
Witch hazel and bactroban.   Ingrown Hair An ingrown hair is a hair that curls and re-enters the skin instead of growing straight out of the skin. It happens most often with curly hair. It is usually more severe in the neck area, but it can occur in any shaved area, including the beard area, groin, scalp, and legs. An ingrown hair may cause small pockets of infection. CAUSES  Shaving closely, tweezing, or waxing, especially curly hair. Using hair removal creams can sometimes lead to ingrown hairs, especially in the groin. SYMPTOMS   Small bumps on the skin. The bumps may be filled with pus.  Pain.  Itching. DIAGNOSIS  Your caregiver can usually tell what is wrong by doing a physical exam. TREATMENT  If there is a severe infection, your caregiver may prescribe antibiotic medicines. Laser hair removal may also be done to help prevent regrowth of the hair. HOME CARE INSTRUCTIONS   Do not shave irritated skin. You may start shaving again once the irritation has gone away.  If you are prone to ingrown hairs, consider not shaving as much as possible.  If antibiotics are prescribed, take them as directed. Finish them even if you start to feel better.  You may use a facial sponge in a gentle circular motion to help dislodge ingrown hairs on the face.  You may use a hair removal cream weekly, especially on the legs and underarms. Stop using the cream if it irritates your skin. Use caution when using hair removal creams in the groin area. SHAVING INSTRUCTIONS AFTER TREATMENT  Shower before shaving. Keep areas to be shaved packed in warm, moist wraps for several minutes before shaving. The warm, moist environment helps soften the hairs and makes ingrown hairs less likely to occur.  Use thick shaving gels.  Use a bump fighter razor that cuts hair slightly above the skin level or use an electric shaver with a longer shave setting.  Shave in the direction of hair growth. Avoid making multiple  razor strokes.  Use moisturizing lotions after shaving. Document Released: 01/14/2001 Document Revised: 04/08/2012 Document Reviewed: 01/08/2012 [facility] Patient Information 2015 [place], [place]. This information is not intended to replace advice given to you by your health care provider. Make sure you discuss any questions you have with your health care provider.

## 2014-12-07 NOTE — Progress Notes (Signed)
Subjective:    Patient ID: Cynthia Sanchez, female    DOB: 1991-02-09, 24 y.o.   MRN: [id number]  HPI Pt is a 24 yo female who presents to the clinic to establish care.   .. Active Ambulatory Problems    Diagnosis Date Noted  . ADHD 04/01/2009  . MIGRAINE HEADACHE 04/01/2009  . 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 IRREGULAR MENSES 04/01/2009  . ORTHOSTATIC DIZZINESS 08/18/2009  . HEADACHE 04/14/2010  . TACHYCARDIA 08/18/2009  . Pleuritic chest pain 03/27/2011  . Chest pain 03/27/2011  . History of ongoing treatment with hormonal therapy 03/27/2011  . 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 Breast lump on left side at 12 o'clock position  [place] 10 13 follow 07/29/2012  . Hypokalemia 07/30/2012  . 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 Weight loss, unintentional 08/03/2012  . Underweight 08/03/2012  . Palpitation 12/30/2012  . Medication management 12/30/2012  . Tear of lateral meniscus of left knee   . S/P ACL reconstruction   . Migraines   . History of chicken pox   . ADHD (attention deficit hyperactivity disorder)   . Hx of supraventricular tachycardia 10/26/2013  . Back pain acute  10/26/2013  . Abnormal finding of kidney 11/05/2013   Resolved Ambulatory Problems    Diagnosis Date Noted  . No Resolved Ambulatory Problems   Past Medical History  Diagnosis Date  . Chronic migraine   . History of tachycardia   . Dental crowns present   . Sinus arrhythmia   . Migraine   . Tachycardia    .Marland Kitchen Family History  Problem Relation Age of Onset  . Breast cancer Mother   . ADD / ADHD Mother   . Hyperlipidemia Mother   . Cancer Mother   . Asthma Father   . Migraines Father   . Pulmonary embolism Maternal Uncle    .Marland Kitchen History   Social History  . Marital Status: Single    Spouse Name: N/A  . Number of Children: N/A  . Years of Education: N/A   Occupational History  . Not on file.   Social History Main Topics  . Smoking status: Never Smoker   . Smokeless  tobacco: Never Used  . Alcohol Use: 2.4 oz/week    4 Standard drinks or equivalent per week     Comment: occasionally  . Drug Use: No     Comment: experimented,doesn't do it now  . Sexual Activity: Not on file   Other Topics Concern  . Not on file   Social History Narrative   HH of 3   Neg ets   Not a vegan   Exercise es   Neg tad    ECU wants to be Physc assist. Sleep interrupted   Father MD and Mom RN   Light sleeper  concerned with right breast lump for last 6 months. Not growing or painful. No erythema or warmth of breast. Hx of breast lumps with left bx showing fibroadenoma. Concerned due to family hx breast cancer with mother at 72. No BRCA testing has been done. No nipple retraction or discharge.   Review of Systems  All other systems reviewed and are negative.      Objective:   Physical Exam  Constitutional: She appears well-developed and well-nourished.  HENT:  Head: Normocephalic and atraumatic.  Cardiovascular: Normal rate, regular rhythm and normal heart sounds.   Pulmonary/Chest: Effort normal and breath sounds normal.    Skin: Skin is dry.  Psychiatric: She has a normal mood  and affect. Her behavior is normal.          Assessment & Plan:  Breast lump 11 oclock right- hx of breast cancer mom and 20, fibroadenoma of left breast. Will send for ultrasound to further evaluate.   Ingrown pubic hairs/pubic irritation- discussed not waxing can make worse. Gave HO for prevention. Discussed witch hazel use daily. bactroban given for more erythematous bumps.   Needs CPE and fasting labs.   Tdap given today.

## 2014-12-22 ENCOUNTER — Ambulatory Visit (INDEPENDENT_AMBULATORY_CARE_PROVIDER_SITE_OTHER): Payer: 59 | Admitting: Physician Assistant

## 2014-12-22 ENCOUNTER — Encounter: Payer: Self-pay | Admitting: Physician Assistant

## 2014-12-22 ENCOUNTER — Ambulatory Visit (INDEPENDENT_AMBULATORY_CARE_PROVIDER_SITE_OTHER): Payer: 59

## 2014-12-22 VITALS — BP 127/77 | HR 84 | Ht 65.0 in | Wt 114.0 lb

## 2014-12-22 DIAGNOSIS — R935 Abnormal findings on diagnostic imaging of other abdominal regions, including retroperitoneum: Secondary | ICD-10-CM | POA: Diagnosis not present

## 2014-12-22 DIAGNOSIS — R93429 Abnormal radiologic findings on diagnostic imaging of unspecified kidney: Secondary | ICD-10-CM | POA: Insufficient documentation

## 2014-12-22 DIAGNOSIS — R934 Abnormal findings on diagnostic imaging of urinary organs: Secondary | ICD-10-CM

## 2014-12-22 DIAGNOSIS — R1010 Upper abdominal pain, unspecified: Secondary | ICD-10-CM | POA: Diagnosis not present

## 2014-12-22 DIAGNOSIS — R109 Unspecified abdominal pain: Secondary | ICD-10-CM

## 2014-12-22 DIAGNOSIS — M549 Dorsalgia, unspecified: Secondary | ICD-10-CM

## 2014-12-22 LAB — POCT URINALYSIS DIPSTICK
Bilirubin, UA: NEGATIVE
GLUCOSE UA: NEGATIVE
Ketones, UA: NEGATIVE
Leukocytes, UA: NEGATIVE
NITRITE UA: NEGATIVE
Protein, UA: NEGATIVE
RBC UA: NEGATIVE
Spec Grav, UA: 1.01
Urobilinogen, UA: 0.2
pH, UA: 6

## 2014-12-22 MED ORDER — CYCLOBENZAPRINE HCL 10 MG PO TABS: 10.0000 mg | ORAL_TABLET | Freq: Three times a day (TID) | ORAL | Status: AC | PRN

## 2014-12-22 MED ORDER — KETOROLAC TROMETHAMINE 30 MG/ML IJ SOLN
30.0000 mg | Freq: Once | INTRAMUSCULAR | Status: AC
  Administered 2014-12-22: 30 mg via INTRAMUSCULAR

## 2014-12-22 NOTE — Progress Notes (Signed)
 Subjective:    Patient ID: Cynthia Sanchez, female    DOB: 07/13/1991, 23 y.o.   MRN: [id number]  HPI  Pt is a 23 yo female who presents to the clinic with bilateral flank/mid-low back pain that started yesterday. She denies any fever, chills, n/v/d, urinary symptoms, abdominal pain. No SOB or CP. She does have hx of spontaneous pneumothorax. She did notice pain started after running. Tylenol does help. No change in bowel movements. 2 days ago she tripped hard but did not fall. ROM does not effect pain. Pt had similar incident about 1 year ago but does admit pain was more severe 10/10 and brought to tears. This episode is bearable but consisent and very dull.  Renal u/s showed some atrophy of right kidney suggestive of renal disease. She was seen by neurologist but did not seem concerned. No problems since.      Review of Systems  All other systems reviewed and are negative.      Objective:   Physical Exam  Constitutional: She is oriented to person, place, and time. She appears well-developed and well-nourished.  HENT:  Head: Normocephalic and atraumatic.  Cardiovascular: Normal rate, regular rhythm and normal heart sounds.   Pulmonary/Chest: Effort normal and breath sounds normal. She has no wheezes.  Mild tenderness with percussion of bilateral CVA.   Abdominal: Soft. Bowel sounds are normal. She exhibits no distension and no mass. There is no tenderness. There is no rebound and no guarding.  Musculoskeletal:  No tenderness of spine to palpation. No tenderness over Paraspinous muscles to palpation.  ROM good at waist.   Neurological: She is alert and oriented to person, place, and time.  Skin: Skin is dry.  Psychiatric: She has a normal mood and affect. Her behavior is normal.          Assessment & Plan:  Bilateral flank pain/abnormal renal U/S- .. Results for orders placed or performed in visit on 12/22/14  Basic Metabolic Panel (BMET)  Result Value Ref Range   Sodium  140 135 - 145 mEq/L   Potassium 4.3 3.5 - 5.3 mEq/L   Chloride 104 96 - 112 mEq/L   CO2 27 19 - 32 mEq/L   Glucose, Bld 81 70 - 99 mg/dL   BUN 16 6 - 23 mg/dL   Creat 0.96 0.50 - 1.10 mg/dL   Calcium 9.3 8.4 - 10.5 mg/dL  CBC w/Diff  Result Value Ref Range   WBC 7.6 4.0 - 10.5 K/uL   RBC 4.60 3.87 - 5.11 MIL/uL   Hemoglobin 14.1 12.0 - 15.0 g/dL   HCT 42.5 36.0 - 46.0 %   MCV 92.4 78.0 - 100.0 fL   MCH 30.7 26.0 - 34.0 pg   MCHC 33.2 30.0 - 36.0 g/dL   RDW 13.6 11.5 - 15.5 %   Platelets 215 150 - 400 K/uL   MPV 10.4 8.6 - 12.4 fL   Neutrophils Relative % 71 43 - 77 %   Neutro Abs 5.4 1.7 - 7.7 K/uL   Lymphocytes Relative 20 12 - 46 %   Lymphs Abs 1.5 0.7 - 4.0 K/uL   Monocytes Relative 8 3 - 12 %   Monocytes Absolute 0.6 0.1 - 1.0 K/uL   Eosinophils Relative 1 0 - 5 %   Eosinophils Absolute 0.1 0.0 - 0.7 K/uL   Basophils Relative 0 0 - 1 %   Basophils Absolute 0.0 0.0 - 0.1 K/uL   Smear Review Criteria for review not met     Urinalysis, microscopic only  Result Value Ref Range   Squamous Epithelial / LPF RARE RARE   Crystals NONE SEEN NONE SEEN   Casts NONE SEEN NONE SEEN   WBC, UA 0-2 <3 WBC/hpf   RBC / HPF 0-2 <3 RBC/hpf   Bacteria, UA RARE RARE  POCT urinalysis dipstick  Result Value Ref Range   Color, UA yellow    Clarity, UA slightly cloudy    Glucose, UA neg    Bilirubin, UA neg    Ketones, UA neg    Spec Grav, UA 1.010    Blood, UA neg    pH, UA 6.0    Protein, UA neg    Urobilinogen, UA 0.2    Nitrite, UA neg    Leukocytes, UA Negative    Will culture. Kidney function looks good. WBC looks good no infection. Renal ultrasound shows further atrophy of both kidneys. Will send to nephrology for further investigation. In the meantime do not use any NSAId. Tylenol for pain. Toradol 30mg IM given in office today. Will treat more like musculoskeletal etiology at this point. Encouraged warm compresses. i did give flexeril and discussed sedation warning. Follow up if  worsening or changing.  

## 2014-12-22 NOTE — Patient Instructions (Signed)
Continue tylenol since helped.  Flexeril 1/2 tablet.  Heat compresses.

## 2014-12-23 LAB — CBC WITH DIFFERENTIAL/PLATELET
Basophils Absolute: 0 10*3/uL (ref 0.0–0.1)
Basophils Relative: 0 % (ref 0–1)
Eosinophils Absolute: 0.1 10*3/uL (ref 0.0–0.7)
Eosinophils Relative: 1 % (ref 0–5)
HEMATOCRIT: 42.5 % (ref 36.0–46.0)
Hemoglobin: 14.1 g/dL (ref 12.0–15.0)
LYMPHS PCT: 20 % (ref 12–46)
Lymphs Abs: 1.5 10*3/uL (ref 0.7–4.0)
MCH: 30.7 pg (ref 26.0–34.0)
MCHC: 33.2 g/dL (ref 30.0–36.0)
MCV: 92.4 fL (ref 78.0–100.0)
MONOS PCT: 8 % (ref 3–12)
MPV: 10.4 fL (ref 8.6–12.4)
Monocytes Absolute: 0.6 10*3/uL (ref 0.1–1.0)
NEUTROS ABS: 5.4 10*3/uL (ref 1.7–7.7)
Neutrophils Relative %: 71 % (ref 43–77)
Platelets: 215 10*3/uL (ref 150–400)
RBC: 4.6 MIL/uL (ref 3.87–5.11)
RDW: 13.6 % (ref 11.5–15.5)
WBC: 7.6 10*3/uL (ref 4.0–10.5)

## 2014-12-23 LAB — URINALYSIS, MICROSCOPIC ONLY
CRYSTALS: NONE SEEN
Casts: NONE SEEN

## 2014-12-23 LAB — BASIC METABOLIC PANEL
BUN: 16 mg/dL (ref 6–23)
CO2: 27 meq/L (ref 19–32)
Calcium: 9.3 mg/dL (ref 8.4–10.5)
Chloride: 104 mEq/L (ref 96–112)
Creat: 0.96 mg/dL (ref 0.50–1.10)
GLUCOSE: 81 mg/dL (ref 70–99)
POTASSIUM: 4.3 meq/L (ref 3.5–5.3)
Sodium: 140 mEq/L (ref 135–145)

## 2014-12-24 LAB — URINE CULTURE
COLONY COUNT: NO GROWTH
Organism ID, Bacteria: NO GROWTH

## 2015-01-21 ENCOUNTER — Ambulatory Visit
Admission: RE | Admit: 2015-01-21 | Discharge: 2015-01-21 | Disposition: A | Discharge status: Home / Self Care | Payer: 59 | Source: Ambulatory Visit | Attending: Physician Assistant | Admitting: Physician Assistant

## 2015-01-21 ENCOUNTER — Encounter: Payer: Self-pay | Admitting: Physician Assistant

## 2015-01-21 DIAGNOSIS — D242 Benign neoplasm of left breast: Secondary | ICD-10-CM

## 2015-01-21 DIAGNOSIS — Z803 Family history of malignant neoplasm of breast: Secondary | ICD-10-CM

## 2015-01-21 DIAGNOSIS — N6320 Unspecified lump in the left breast, unspecified quadrant: Secondary | ICD-10-CM

## 2015-01-21 DIAGNOSIS — N6310 Unspecified lump in the right breast, unspecified quadrant: Secondary | ICD-10-CM

## 2015-01-21 DIAGNOSIS — N6311 Unspecified lump in the right breast, upper outer quadrant: Secondary | ICD-10-CM

## 2015-01-21 DIAGNOSIS — D241 Benign neoplasm of right breast: Secondary | ICD-10-CM | POA: Insufficient documentation

## 2015-03-16 ENCOUNTER — Other Ambulatory Visit: Payer: Self-pay | Admitting: Obstetrics and Gynecology

## 2015-03-16 ENCOUNTER — Other Ambulatory Visit (HOSPITAL_COMMUNITY)
Admission: RE | Admit: 2015-03-16 | Discharge: 2015-03-16 | Disposition: A | Discharge status: Home / Self Care | Payer: 59 | Source: Ambulatory Visit | Attending: Obstetrics and Gynecology | Admitting: Obstetrics and Gynecology

## 2015-03-16 DIAGNOSIS — Z01419 Encounter for gynecological examination (general) (routine) without abnormal findings: Secondary | ICD-10-CM | POA: Diagnosis present

## 2015-03-17 LAB — CYTOLOGY - PAP

## 2015-07-18 IMAGING — US US RENAL
1 series · 14 of 25 positions shown · non-contrast
Comparison: Ultrasound 10/28/2013.

CLINICAL DATA: Bilateral flank pain.

EXAM:
RENAL/URINARY TRACT ULTRASOUND COMPLETE

[Series 1: us renal · 0.14mm/px · 14 of 37 slices shown]
[im 1/37]
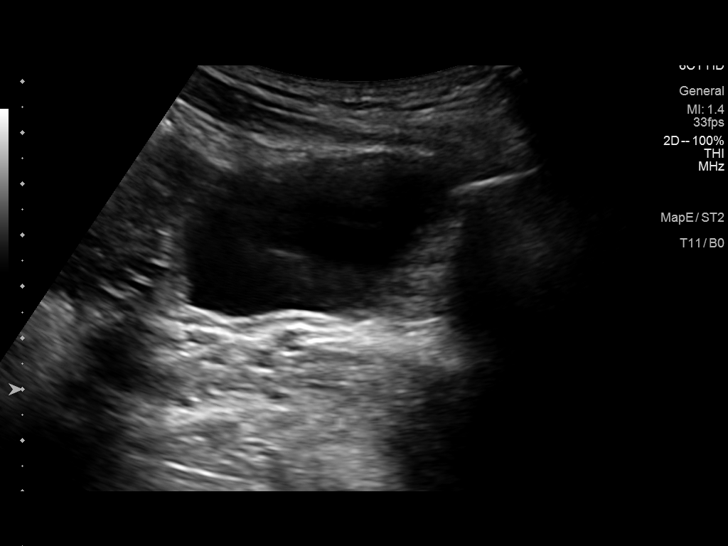
[im 4/37]
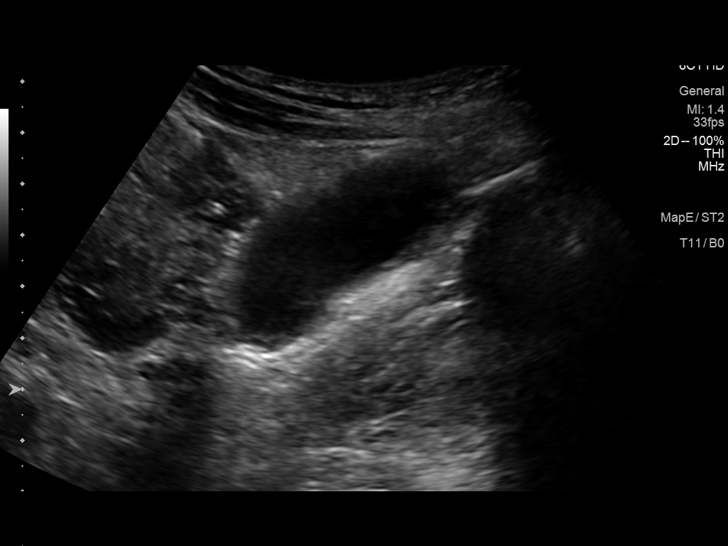
[im 7/37]
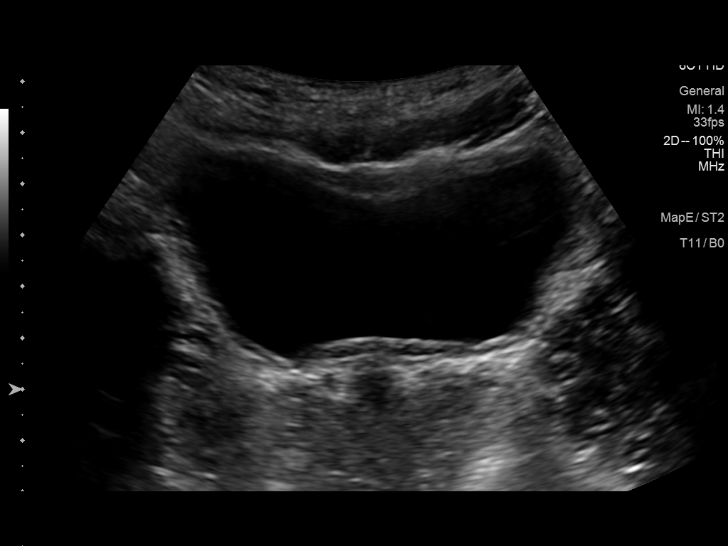
[im 10/37]
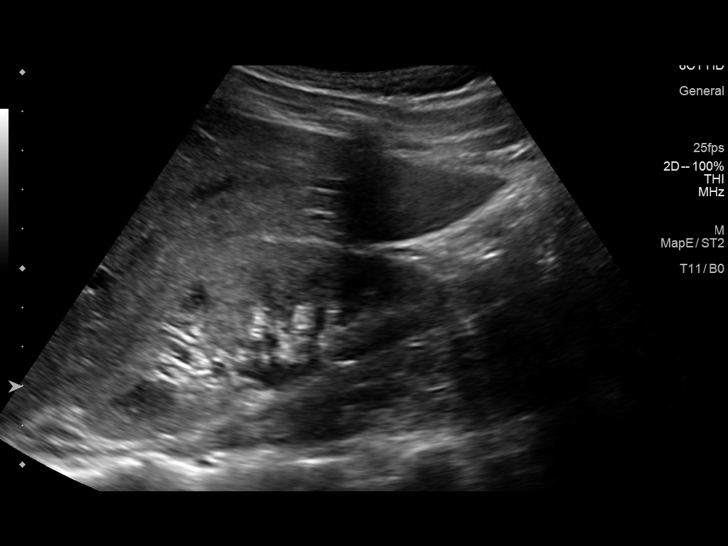
[im 13/37]
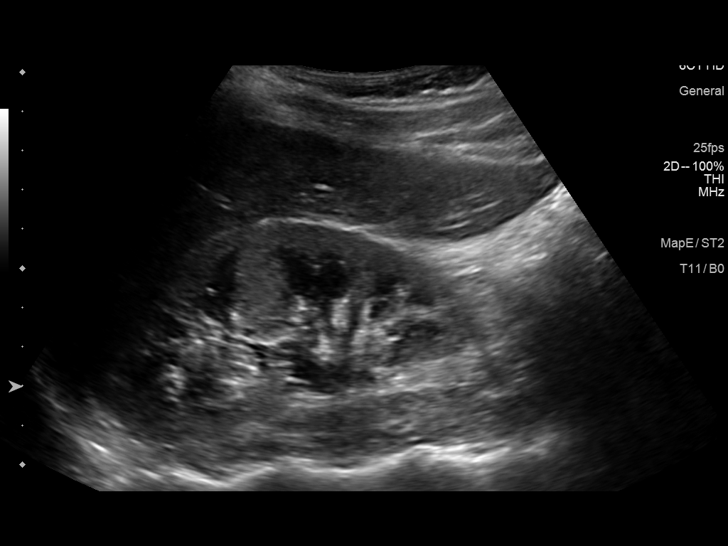
[im 14/37]
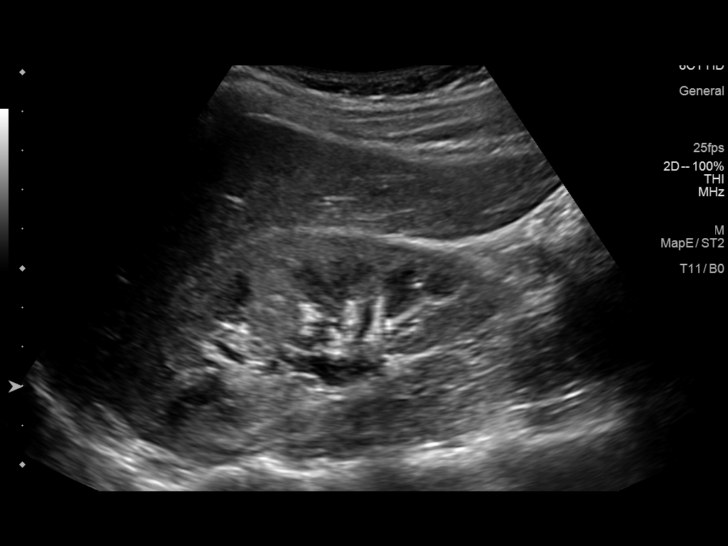
[im 17/37]
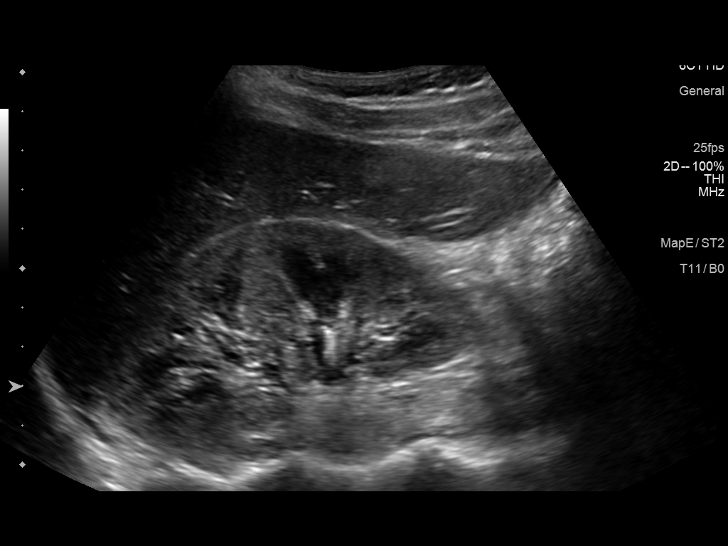
[im 20/37]
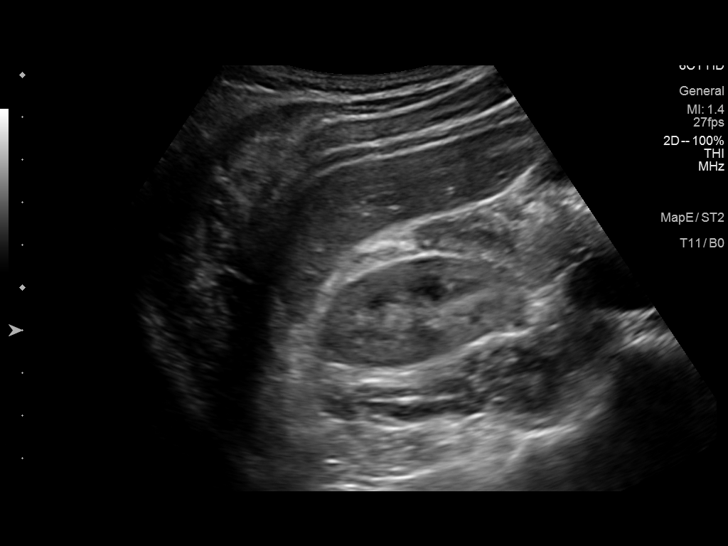
[im 23/37]
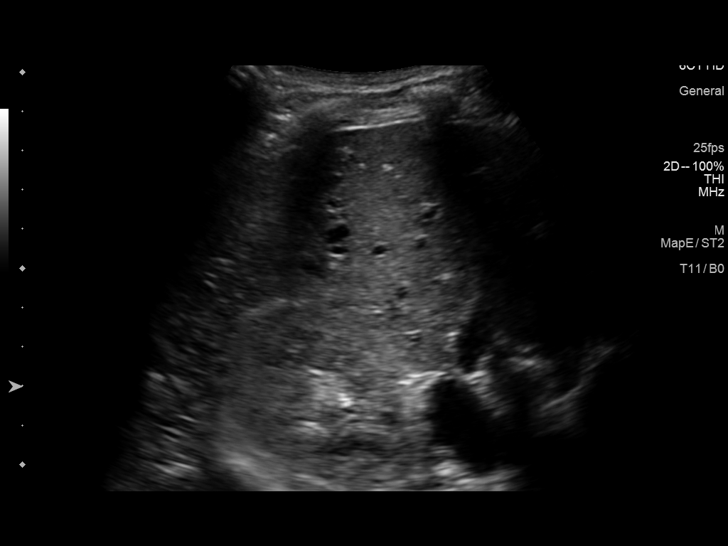
[im 25/37]
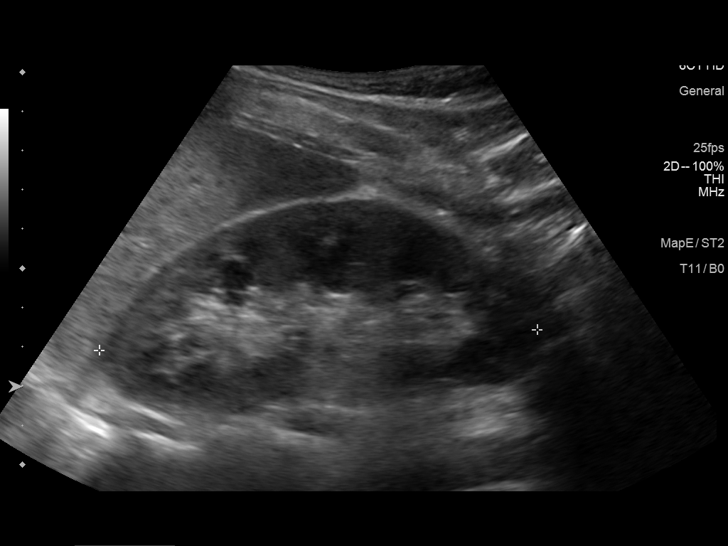
[im 28/37]
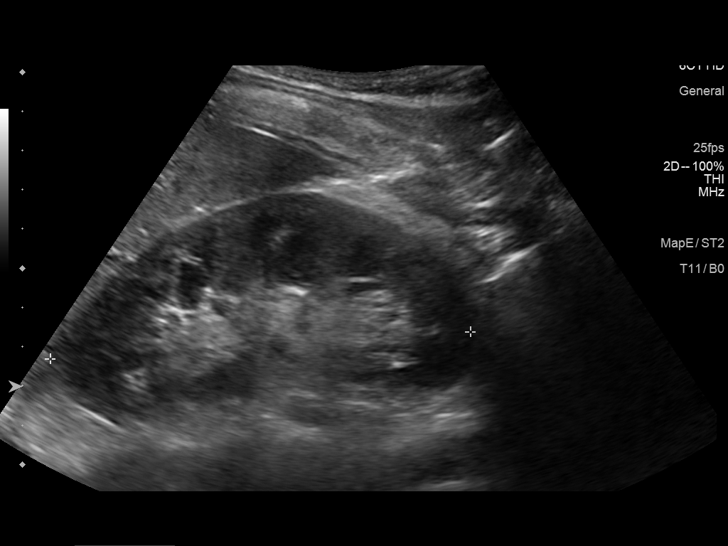
[im 31/37]
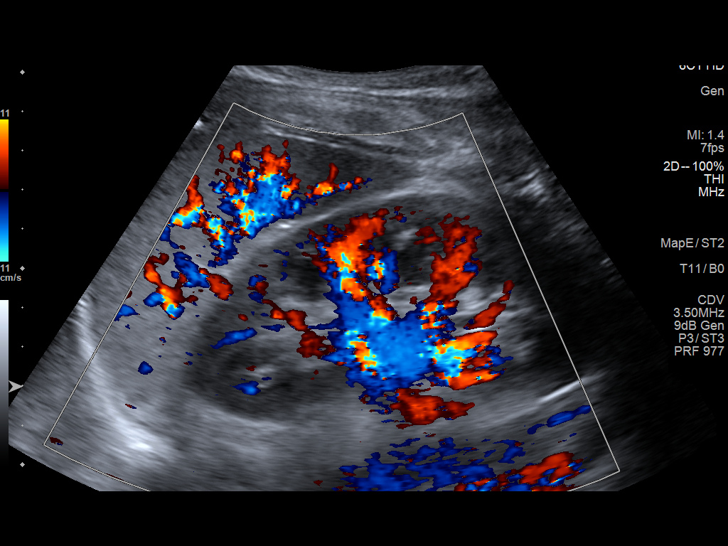
[im 34/37]
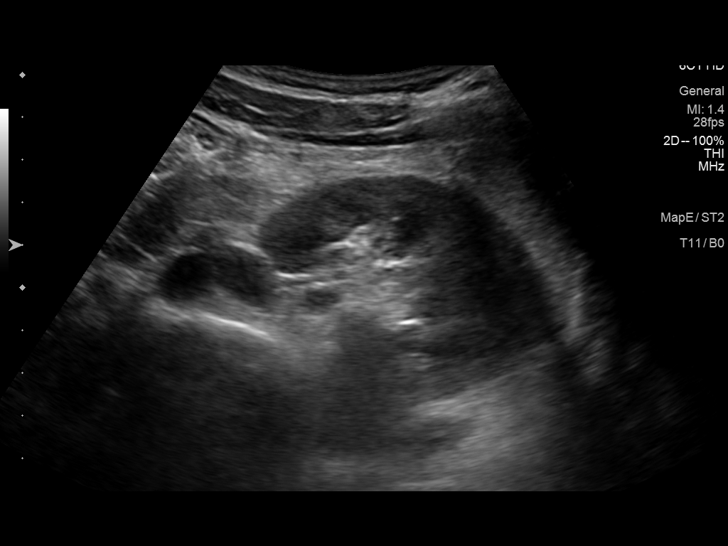
[im 37/37]
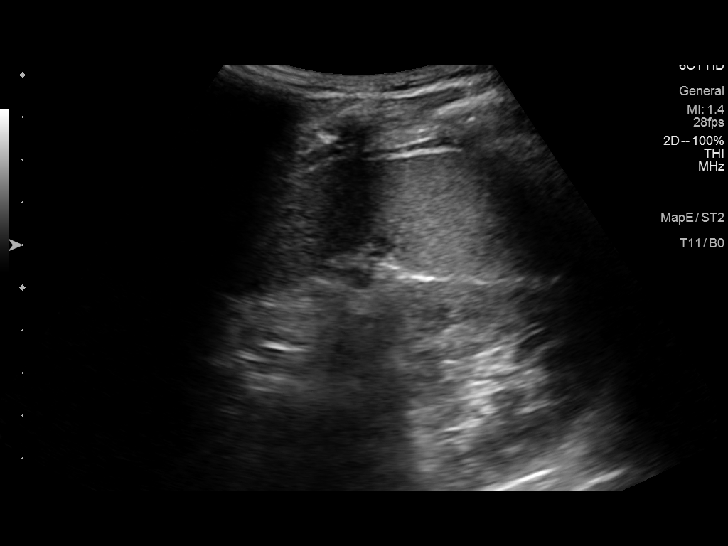

[14 of 25 positions shown; findings below may reference images not displayed]

FINDINGS: Right Kidney:

Length: 9.8 cm. Increased echogenicity again noted consistent
chronic medical renal disease. No mass or hydronephrosis visualized.

Left Kidney:

Length: 10.7 cm. Echogenicity within normal limits. No mass or
hydronephrosis visualized.

Bladder:

Appears normal for degree of bladder distention.
IMPRESSION: 1. Increased echogenicity right kidney is again noted consistent
with chronic medical renal disease.
2. Both kidneys appear slightly smaller than prior exam. Developing
renal atrophy may be present. No hydronephrosis or focal
abnormality.

## 2016-02-01 DIAGNOSIS — B373 Candidiasis of vulva and vagina: Secondary | ICD-10-CM | POA: Diagnosis not present

## 2016-03-13 DIAGNOSIS — N390 Urinary tract infection, site not specified: Secondary | ICD-10-CM | POA: Diagnosis not present

## 2016-04-11 DIAGNOSIS — N390 Urinary tract infection, site not specified: Secondary | ICD-10-CM | POA: Diagnosis not present

## 2016-04-17 DIAGNOSIS — M549 Dorsalgia, unspecified: Secondary | ICD-10-CM | POA: Diagnosis not present

## 2016-04-17 DIAGNOSIS — N3000 Acute cystitis without hematuria: Secondary | ICD-10-CM | POA: Diagnosis not present

## 2016-04-17 DIAGNOSIS — R310 Gross hematuria: Secondary | ICD-10-CM | POA: Diagnosis not present

## 2016-04-20 DIAGNOSIS — N3000 Acute cystitis without hematuria: Secondary | ICD-10-CM | POA: Diagnosis not present

## 2016-04-20 DIAGNOSIS — R310 Gross hematuria: Secondary | ICD-10-CM | POA: Diagnosis not present

## 2016-05-27 DIAGNOSIS — N390 Urinary tract infection, site not specified: Secondary | ICD-10-CM | POA: Diagnosis not present

## 2016-05-27 DIAGNOSIS — R300 Dysuria: Secondary | ICD-10-CM | POA: Diagnosis not present

## 2016-06-24 DIAGNOSIS — T7421XA Adult sexual abuse, confirmed, initial encounter: Secondary | ICD-10-CM | POA: Diagnosis not present

## 2016-07-06 DIAGNOSIS — F4310 Post-traumatic stress disorder, unspecified: Secondary | ICD-10-CM | POA: Diagnosis not present

## 2016-07-13 DIAGNOSIS — F4310 Post-traumatic stress disorder, unspecified: Secondary | ICD-10-CM | POA: Diagnosis not present

## 2016-07-23 DIAGNOSIS — F4310 Post-traumatic stress disorder, unspecified: Secondary | ICD-10-CM | POA: Diagnosis not present

## 2016-07-31 DIAGNOSIS — F4310 Post-traumatic stress disorder, unspecified: Secondary | ICD-10-CM | POA: Diagnosis not present

## 2016-08-09 DIAGNOSIS — F4310 Post-traumatic stress disorder, unspecified: Secondary | ICD-10-CM | POA: Diagnosis not present

## 2016-08-30 DIAGNOSIS — Z803 Family history of malignant neoplasm of breast: Secondary | ICD-10-CM | POA: Diagnosis not present

## 2016-08-30 DIAGNOSIS — Z309 Encounter for contraceptive management, unspecified: Secondary | ICD-10-CM | POA: Diagnosis not present

## 2016-08-30 DIAGNOSIS — Z0000 Encounter for general adult medical examination without abnormal findings: Secondary | ICD-10-CM | POA: Diagnosis not present

## 2016-08-31 ENCOUNTER — Encounter: Payer: Self-pay | Admitting: Physician Assistant

## 2016-10-10 MED FILL — VYVANSE 20 MG CAPSULE: 20 | 90 days supply | Qty: 90 | Fill #0

## 2016-10-10 MED FILL — VYVANSE 70 MG CAPSULE: 70 | 90 days supply | Qty: 90 | Fill #0

## 2016-11-09 DIAGNOSIS — N909 Noninflammatory disorder of vulva and perineum, unspecified: Secondary | ICD-10-CM | POA: Diagnosis not present

## 2016-11-09 DIAGNOSIS — Z01419 Encounter for gynecological examination (general) (routine) without abnormal findings: Secondary | ICD-10-CM | POA: Diagnosis not present

## 2016-11-09 DIAGNOSIS — E282 Polycystic ovarian syndrome: Secondary | ICD-10-CM | POA: Diagnosis not present

## 2016-11-09 DIAGNOSIS — N926 Irregular menstruation, unspecified: Secondary | ICD-10-CM | POA: Diagnosis not present

## 2016-11-19 DIAGNOSIS — F4310 Post-traumatic stress disorder, unspecified: Secondary | ICD-10-CM | POA: Diagnosis not present

## 2016-11-26 DIAGNOSIS — N909 Noninflammatory disorder of vulva and perineum, unspecified: Secondary | ICD-10-CM | POA: Diagnosis not present

## 2016-11-26 DIAGNOSIS — D280 Benign neoplasm of vulva: Secondary | ICD-10-CM | POA: Diagnosis not present

## 2016-12-06 DIAGNOSIS — F4310 Post-traumatic stress disorder, unspecified: Secondary | ICD-10-CM | POA: Diagnosis not present

## 2016-12-20 DIAGNOSIS — F4310 Post-traumatic stress disorder, unspecified: Secondary | ICD-10-CM | POA: Diagnosis not present

## 2017-01-03 DIAGNOSIS — F4310 Post-traumatic stress disorder, unspecified: Secondary | ICD-10-CM | POA: Diagnosis not present

## 2017-01-10 DIAGNOSIS — F331 Major depressive disorder, recurrent, moderate: Secondary | ICD-10-CM | POA: Diagnosis not present

## 2017-01-10 DIAGNOSIS — F329 Major depressive disorder, single episode, unspecified: Secondary | ICD-10-CM | POA: Diagnosis not present

## 2017-01-17 DIAGNOSIS — F4310 Post-traumatic stress disorder, unspecified: Secondary | ICD-10-CM | POA: Diagnosis not present

## 2017-01-23 DIAGNOSIS — F4310 Post-traumatic stress disorder, unspecified: Secondary | ICD-10-CM | POA: Diagnosis not present

## 2017-01-23 MED FILL — VYVANSE 70 MG CAPSULE: 70 | 90 days supply | Qty: 90 | Fill #0

## 2017-01-23 MED FILL — VYVANSE 20 MG CAPSULE: 20 | 90 days supply | Qty: 90 | Fill #0

## 2017-01-24 DIAGNOSIS — F339 Major depressive disorder, recurrent, unspecified: Secondary | ICD-10-CM | POA: Diagnosis not present

## 2017-01-31 DIAGNOSIS — F4310 Post-traumatic stress disorder, unspecified: Secondary | ICD-10-CM | POA: Diagnosis not present

## 2017-02-07 DIAGNOSIS — F4310 Post-traumatic stress disorder, unspecified: Secondary | ICD-10-CM | POA: Diagnosis not present

## 2017-02-25 DIAGNOSIS — F339 Major depressive disorder, recurrent, unspecified: Secondary | ICD-10-CM | POA: Diagnosis not present

## 2017-06-20 DIAGNOSIS — H16223 Keratoconjunctivitis sicca, not specified as Sjogren's, bilateral: Secondary | ICD-10-CM | POA: Diagnosis not present

## 2017-06-20 MED FILL — VYVANSE 70 MG CAPSULE: 70 | 90 days supply | Qty: 90 | Fill #0

## 2017-06-20 MED FILL — VYVANSE 20 MG CAPSULE: 20 | 90 days supply | Qty: 90 | Fill #0

## 2017-07-19 DIAGNOSIS — R079 Chest pain, unspecified: Secondary | ICD-10-CM | POA: Diagnosis not present

## 2017-07-19 DIAGNOSIS — Z8709 Personal history of other diseases of the respiratory system: Secondary | ICD-10-CM | POA: Diagnosis not present

## 2017-08-08 DIAGNOSIS — R6883 Chills (without fever): Secondary | ICD-10-CM | POA: Diagnosis not present

## 2017-08-08 DIAGNOSIS — R110 Nausea: Secondary | ICD-10-CM | POA: Diagnosis not present

## 2017-08-08 DIAGNOSIS — R197 Diarrhea, unspecified: Secondary | ICD-10-CM | POA: Diagnosis not present

## 2017-08-08 DIAGNOSIS — B349 Viral infection, unspecified: Secondary | ICD-10-CM | POA: Diagnosis not present

## 2017-08-21 DIAGNOSIS — F339 Major depressive disorder, recurrent, unspecified: Secondary | ICD-10-CM | POA: Diagnosis not present

## 2017-09-25 MED FILL — VYVANSE 70 MG CAPSULE: 70 | 90 days supply | Qty: 90 | Fill #0

## 2017-09-25 MED FILL — VYVANSE 20 MG CAPSULE: 20 | 90 days supply | Qty: 90 | Fill #0

## 2018-07-18 DIAGNOSIS — F902 Attention-deficit hyperactivity disorder, combined type: Secondary | ICD-10-CM | POA: Diagnosis not present
